# Patient Record
Sex: Female | Born: 1950 | Hispanic: Refuse to answer | Marital: Single | Smoking: Never smoker
Health system: Southern US, Community
[De-identification: ages and names within clinical notes are randomized; demographics above are authoritative.]

## PROBLEM LIST (undated history)

## (undated) DIAGNOSIS — F419 Anxiety disorder, unspecified: Secondary | ICD-10-CM

## (undated) DIAGNOSIS — N951 Menopausal and female climacteric states: Secondary | ICD-10-CM

## (undated) DIAGNOSIS — G4730 Sleep apnea, unspecified: Secondary | ICD-10-CM

## (undated) DIAGNOSIS — G4700 Insomnia, unspecified: Secondary | ICD-10-CM

## (undated) DIAGNOSIS — E785 Hyperlipidemia, unspecified: Secondary | ICD-10-CM

## (undated) DIAGNOSIS — I10 Essential (primary) hypertension: Secondary | ICD-10-CM

## (undated) DIAGNOSIS — E119 Type 2 diabetes mellitus without complications: Secondary | ICD-10-CM

## (undated) DIAGNOSIS — K621 Rectal polyp: Secondary | ICD-10-CM

## (undated) HISTORY — DX: Hyperlipidemia, unspecified: E78.5

## (undated) HISTORY — DX: Insomnia, unspecified: G47.00

## (undated) HISTORY — DX: Type 2 diabetes mellitus without complications: E11.9

## (undated) HISTORY — DX: Rectal polyp: K62.1

## (undated) HISTORY — DX: Essential (primary) hypertension: I10

## (undated) HISTORY — DX: Menopausal and female climacteric states: N95.1

## (undated) HISTORY — PX: LAPAROTOMY: SHX154

## (undated) HISTORY — DX: Anxiety disorder, unspecified: F41.9

## (undated) HISTORY — PX: OTHER SURGICAL HISTORY: SHX169

## (undated) HISTORY — PX: TONSILLECTOMY: SUR1361

## (undated) HISTORY — DX: Sleep apnea, unspecified: G47.30

---

## 2000-07-28 ENCOUNTER — Other Ambulatory Visit: Admission: RE | Admit: 2000-07-28 | Discharge: 2000-07-28 | Payer: Self-pay | Admitting: Internal Medicine

## 2000-09-02 ENCOUNTER — Encounter: Payer: Self-pay | Admitting: Internal Medicine

## 2000-09-02 ENCOUNTER — Encounter: Admission: RE | Admit: 2000-09-02 | Discharge: 2000-09-02 | Payer: Self-pay | Admitting: Internal Medicine

## 2001-04-22 LAB — HM COLONOSCOPY: HM Colonoscopy: NORMAL

## 2003-07-01 ENCOUNTER — Encounter (INDEPENDENT_AMBULATORY_CARE_PROVIDER_SITE_OTHER): Payer: Self-pay | Admitting: *Deleted

## 2003-07-01 ENCOUNTER — Ambulatory Visit (HOSPITAL_COMMUNITY): Admission: RE | Admit: 2003-07-01 | Discharge: 2003-07-01 | Payer: Self-pay | Admitting: Gastroenterology

## 2004-04-22 LAB — HM MAMMOGRAPHY: HM Mammogram: NORMAL

## 2005-10-24 ENCOUNTER — Ambulatory Visit: Payer: Self-pay | Admitting: Family Medicine

## 2005-11-29 ENCOUNTER — Ambulatory Visit: Payer: Self-pay | Admitting: Family Medicine

## 2006-03-14 ENCOUNTER — Ambulatory Visit: Payer: Self-pay | Admitting: Family Medicine

## 2006-03-14 LAB — CONVERTED CEMR LAB
ALT: 24 units/L (ref 0–40)
AST: 18 units/L (ref 0–37)
Chol/HDL Ratio, serum: 2.8
Cholesterol: 151 mg/dL (ref 0–200)
HDL: 54.1 mg/dL (ref >39.0)
LDL Cholesterol: 87 mg/dL (ref 0–99)
Triglyceride fasting, serum: 51 mg/dL (ref 0–149)
VLDL: 10 mg/dL (ref 0–40)

## 2007-03-05 ENCOUNTER — Ambulatory Visit: Payer: Self-pay | Admitting: Family Medicine

## 2007-03-05 DIAGNOSIS — E785 Hyperlipidemia, unspecified: Secondary | ICD-10-CM

## 2007-03-05 DIAGNOSIS — E119 Type 2 diabetes mellitus without complications: Secondary | ICD-10-CM | POA: Insufficient documentation

## 2007-03-05 DIAGNOSIS — K620 Anal polyp: Secondary | ICD-10-CM | POA: Insufficient documentation

## 2007-03-05 DIAGNOSIS — K621 Rectal polyp: Secondary | ICD-10-CM

## 2007-03-05 DIAGNOSIS — I10 Essential (primary) hypertension: Secondary | ICD-10-CM | POA: Insufficient documentation

## 2007-03-05 DIAGNOSIS — N809 Endometriosis, unspecified: Secondary | ICD-10-CM | POA: Insufficient documentation

## 2007-03-12 ENCOUNTER — Telehealth (INDEPENDENT_AMBULATORY_CARE_PROVIDER_SITE_OTHER): Payer: Self-pay | Admitting: *Deleted

## 2007-03-12 ENCOUNTER — Encounter (INDEPENDENT_AMBULATORY_CARE_PROVIDER_SITE_OTHER): Payer: Self-pay | Admitting: Family Medicine

## 2007-03-12 ENCOUNTER — Encounter (INDEPENDENT_AMBULATORY_CARE_PROVIDER_SITE_OTHER): Payer: Self-pay | Admitting: *Deleted

## 2007-03-16 ENCOUNTER — Ambulatory Visit: Payer: Self-pay | Admitting: Family Medicine

## 2007-03-16 DIAGNOSIS — M549 Dorsalgia, unspecified: Secondary | ICD-10-CM | POA: Insufficient documentation

## 2007-03-18 ENCOUNTER — Telehealth (INDEPENDENT_AMBULATORY_CARE_PROVIDER_SITE_OTHER): Payer: Self-pay | Admitting: *Deleted

## 2007-03-30 ENCOUNTER — Encounter (INDEPENDENT_AMBULATORY_CARE_PROVIDER_SITE_OTHER): Payer: Self-pay | Admitting: Family Medicine

## 2007-04-02 ENCOUNTER — Encounter (INDEPENDENT_AMBULATORY_CARE_PROVIDER_SITE_OTHER): Payer: Self-pay | Admitting: Family Medicine

## 2007-06-26 ENCOUNTER — Ambulatory Visit: Payer: Self-pay | Admitting: Family Medicine

## 2007-06-26 ENCOUNTER — Encounter (INDEPENDENT_AMBULATORY_CARE_PROVIDER_SITE_OTHER): Payer: Self-pay | Admitting: *Deleted

## 2007-06-26 LAB — CONVERTED CEMR LAB
ALT: 20 units/L (ref 0–35)
AST: 14 units/L (ref 0–37)
BUN: 13 mg/dL (ref 6–23)
CO2: 30 meq/L (ref 19–32)
Calcium: 9.3 mg/dL (ref 8.4–10.5)
Chloride: 108 meq/L (ref 96–112)
Cholesterol: 158 mg/dL (ref 0–200)
Creatinine, Ser: 0.9 mg/dL (ref 0.4–1.2)
GFR calc Af Amer: 84 mL/min
GFR calc non Af Amer: 69 mL/min
Glucose, Bld: 115 mg/dL — ABNORMAL HIGH (ref 70–99)
HDL: 51.1 mg/dL (ref >39.0)
Hgb A1c MFr Bld: 7.2 % — ABNORMAL HIGH (ref 4.6–6.0)
LDL Cholesterol: 97 mg/dL (ref 0–99)
Potassium: 4.1 meq/L (ref 3.5–5.1)
Sodium: 143 meq/L (ref 135–145)
Total CHOL/HDL Ratio: 3.1
Triglycerides: 50 mg/dL (ref 0–149)
VLDL: 10 mg/dL (ref 0–40)

## 2007-08-05 ENCOUNTER — Ambulatory Visit: Payer: Self-pay | Admitting: Internal Medicine

## 2007-10-02 ENCOUNTER — Ambulatory Visit: Payer: Self-pay | Admitting: Internal Medicine

## 2007-10-05 ENCOUNTER — Encounter (INDEPENDENT_AMBULATORY_CARE_PROVIDER_SITE_OTHER): Payer: Self-pay | Admitting: *Deleted

## 2007-11-03 ENCOUNTER — Encounter: Payer: Self-pay | Admitting: Internal Medicine

## 2007-11-09 ENCOUNTER — Encounter: Admission: RE | Admit: 2007-11-09 | Discharge: 2008-01-11 | Payer: Self-pay | Admitting: Internal Medicine

## 2007-11-24 ENCOUNTER — Encounter: Payer: Self-pay | Admitting: Internal Medicine

## 2007-12-22 ENCOUNTER — Ambulatory Visit: Payer: Self-pay | Admitting: Internal Medicine

## 2007-12-22 LAB — HM DIABETES FOOT EXAM

## 2007-12-29 LAB — CONVERTED CEMR LAB
BUN: 15 mg/dL (ref 6–23)
CO2: 32 meq/L (ref 19–32)
Calcium: 9.6 mg/dL (ref 8.4–10.5)
Creatinine, Ser: 1 mg/dL (ref 0.4–1.2)
GFR calc Af Amer: 74 mL/min
Glucose, Bld: 87 mg/dL (ref 70–99)

## 2008-01-01 ENCOUNTER — Encounter: Payer: Self-pay | Admitting: Internal Medicine

## 2008-01-13 ENCOUNTER — Encounter: Payer: Self-pay | Admitting: Internal Medicine

## 2008-02-15 ENCOUNTER — Encounter: Payer: Self-pay | Admitting: Internal Medicine

## 2008-03-01 ENCOUNTER — Telehealth (INDEPENDENT_AMBULATORY_CARE_PROVIDER_SITE_OTHER): Payer: Self-pay | Admitting: *Deleted

## 2008-04-26 ENCOUNTER — Encounter: Payer: Self-pay | Admitting: Internal Medicine

## 2008-07-21 LAB — HM COLONOSCOPY

## 2008-07-29 ENCOUNTER — Ambulatory Visit: Payer: Self-pay | Admitting: Internal Medicine

## 2008-08-01 LAB — CONVERTED CEMR LAB
BUN: 17 mg/dL (ref 6–23)
CO2: 31 meq/L (ref 19–32)
Chloride: 104 meq/L (ref 96–112)
Cholesterol: 142 mg/dL (ref 0–200)
GFR calc non Af Amer: 83.09 mL/min (ref >60)
Glucose, Bld: 114 mg/dL — ABNORMAL HIGH (ref 70–99)
Hgb A1c MFr Bld: 7.1 % — ABNORMAL HIGH (ref 4.6–6.5)
Microalb Creat Ratio: 2.5 mg/g (ref 0.0–30.0)
Potassium: 3.9 meq/L (ref 3.5–5.1)
Sodium: 141 meq/L (ref 135–145)
Triglycerides: 90 mg/dL (ref 0.0–149.0)
VLDL: 18 mg/dL (ref 0.0–40.0)

## 2008-08-17 ENCOUNTER — Encounter: Payer: Self-pay | Admitting: Internal Medicine

## 2008-08-24 ENCOUNTER — Encounter: Payer: Self-pay | Admitting: Internal Medicine

## 2009-02-07 ENCOUNTER — Ambulatory Visit: Payer: Self-pay | Admitting: Internal Medicine

## 2009-02-17 LAB — CONVERTED CEMR LAB
ALT: 23 units/L (ref 0–35)
AST: 16 units/L (ref 0–37)
Albumin: 3.8 g/dL (ref 3.5–5.2)
BUN: 13 mg/dL (ref 6–23)
Basophils Absolute: 0.1 10*3/uL (ref 0.0–0.1)
Creatinine, Ser: 1 mg/dL (ref 0.4–1.2)
GFR calc non Af Amer: 73.44 mL/min (ref >60)
Glucose, Bld: 106 mg/dL — ABNORMAL HIGH (ref 70–99)
Hemoglobin: 13 g/dL (ref 12.0–15.0)
Hgb A1c MFr Bld: 6.9 % — ABNORMAL HIGH (ref 4.6–6.5)
Lymphocytes Relative: 35.2 % (ref 12.0–46.0)
Monocytes Relative: 4 % (ref 3.0–12.0)
Platelets: 211 10*3/uL (ref 150.0–400.0)
Potassium: 4.3 meq/L (ref 3.5–5.1)
RDW: 13.8 % (ref 11.5–14.6)
TSH: 0.75 microintl units/mL (ref 0.35–5.50)
WBC: 4.5 10*3/uL (ref 4.5–10.5)

## 2009-03-25 ENCOUNTER — Encounter: Payer: Self-pay | Admitting: Internal Medicine

## 2009-06-09 ENCOUNTER — Encounter (INDEPENDENT_AMBULATORY_CARE_PROVIDER_SITE_OTHER): Payer: Self-pay | Admitting: *Deleted

## 2009-06-09 ENCOUNTER — Ambulatory Visit: Payer: Self-pay | Admitting: Internal Medicine

## 2009-07-14 ENCOUNTER — Encounter: Payer: Self-pay | Admitting: Internal Medicine

## 2009-08-02 ENCOUNTER — Encounter (INDEPENDENT_AMBULATORY_CARE_PROVIDER_SITE_OTHER): Payer: Self-pay | Admitting: *Deleted

## 2009-09-01 ENCOUNTER — Ambulatory Visit: Payer: Self-pay | Admitting: Internal Medicine

## 2009-09-01 DIAGNOSIS — R799 Abnormal finding of blood chemistry, unspecified: Secondary | ICD-10-CM

## 2009-09-13 ENCOUNTER — Telehealth (INDEPENDENT_AMBULATORY_CARE_PROVIDER_SITE_OTHER): Payer: Self-pay | Admitting: *Deleted

## 2009-09-13 LAB — CONVERTED CEMR LAB
Alkaline Phosphatase: 134 units/L — ABNORMAL HIGH (ref 39–117)
Bilirubin, Direct: 0.1 mg/dL (ref 0.0–0.3)

## 2009-09-14 ENCOUNTER — Encounter (INDEPENDENT_AMBULATORY_CARE_PROVIDER_SITE_OTHER): Payer: Self-pay | Admitting: *Deleted

## 2009-09-29 ENCOUNTER — Ambulatory Visit (HOSPITAL_COMMUNITY): Admission: RE | Admit: 2009-09-29 | Discharge: 2009-09-29 | Payer: Self-pay | Admitting: Internal Medicine

## 2009-10-02 ENCOUNTER — Telehealth: Payer: Self-pay | Admitting: Internal Medicine

## 2010-03-12 ENCOUNTER — Ambulatory Visit: Payer: Self-pay | Admitting: Internal Medicine

## 2010-03-12 DIAGNOSIS — M25519 Pain in unspecified shoulder: Secondary | ICD-10-CM | POA: Insufficient documentation

## 2010-03-14 LAB — CONVERTED CEMR LAB
ALT: 23 units/L (ref 0–35)
AST: 20 units/L (ref 0–37)
Alkaline Phosphatase: 138 units/L — ABNORMAL HIGH (ref 39–117)
BUN: 20 mg/dL (ref 6–23)
Bilirubin, Direct: 0 mg/dL (ref 0.0–0.3)
Chloride: 99 meq/L (ref 96–112)
Cholesterol: 189 mg/dL (ref 0–200)
Creatinine, Ser: 1 mg/dL (ref 0.4–1.2)
HDL: 55.8 mg/dL (ref >39.00)
LDL Cholesterol: 115 mg/dL — ABNORMAL HIGH (ref 0–99)
Microalb, Ur: 0.4 mg/dL (ref 0.0–1.9)
Total Bilirubin: 0.4 mg/dL (ref 0.3–1.2)
Triglycerides: 89 mg/dL (ref 0.0–149.0)
VLDL: 17.8 mg/dL (ref 0.0–40.0)

## 2010-03-29 ENCOUNTER — Telehealth (INDEPENDENT_AMBULATORY_CARE_PROVIDER_SITE_OTHER): Payer: Self-pay | Admitting: *Deleted

## 2010-04-02 ENCOUNTER — Ambulatory Visit: Payer: Self-pay | Admitting: Internal Medicine

## 2010-04-02 DIAGNOSIS — J069 Acute upper respiratory infection, unspecified: Secondary | ICD-10-CM | POA: Insufficient documentation

## 2010-05-20 LAB — CONVERTED CEMR LAB
ALT: 29 units/L (ref 0–35)
BUN: 12 mg/dL (ref 6–23)
CO2: 33 meq/L — ABNORMAL HIGH (ref 19–32)
Calcium: 10.3 mg/dL (ref 8.4–10.5)
Chloride: 103 meq/L (ref 96–112)
Cholesterol: 220 mg/dL (ref 0–200)
Creatinine, Ser: 0.9 mg/dL (ref 0.4–1.2)
Creatinine,U: 150.9 mg/dL
Direct LDL: 162.2 mg/dL
GFR calc Af Amer: 84 mL/min
GFR calc non Af Amer: 69 mL/min
Glucose, Bld: 95 mg/dL (ref 70–99)
HDL: 53.1 mg/dL (ref >39.0)
Hgb A1c MFr Bld: 6.9 % — ABNORMAL HIGH (ref 4.6–6.0)
Microalb Creat Ratio: 2 mg/g (ref 0.0–30.0)
Microalb, Ur: 0.3 mg/dL (ref 0.0–1.9)
Potassium: 4.3 meq/L (ref 3.5–5.1)
Sodium: 143 meq/L (ref 135–145)
Total CHOL/HDL Ratio: 4.1
Triglycerides: 73 mg/dL (ref 0–149)
VLDL: 15 mg/dL (ref 0–40)

## 2010-05-22 NOTE — Progress Notes (Signed)
Summary: [facility] correct [facility]  Phone Note Call from Patient   Caller: Patient Summary of Call: patient called to request that we enter her pharmacy choice to be the [facility] [address] [facility], NOT the [facility] [place] has been able to have [facility] [place] [facility] in the past, but would like [place] to note the correct pharmacy for any future prescriptions or refills Initial call taken by: Jerolyn Shin,  March 29, 2010 9:21 AM  Follow-up for Phone Call        noted. old pharmacies were removed.  Follow-up by: Army Fossa CMA,  March 29, 2010 9:29 AM

## 2010-05-22 NOTE — Progress Notes (Signed)
Summary: lab results  Phone Note Outgoing Call Call back at Home Phone [phone] Call back at Work Phone [phone]   Reason for Call: Discuss lab or test results Details for Reason: advise patient: Alkaline phosphatase continued to be slightly elevated, please schedule a bone scan, DX increased AP diabetes well controlled RF all  medications for a year Signed by Elita Quick E. Paz MD on 09/13/2009 at 2:52 PM Summary of Call: discussed with pt.......Marland KitchenShary Decamp  Sep 13, 2009 3:26 PM     Prescriptions: PROTONIX 40 MG TBEC (PANTOPRAZOLE SODIUM) Take one tablet daily  #30 x 11   Entered by:   Shary Decamp   Authorized by:   Nolon Rod. Paz MD   Signed by:   Shary Decamp on 09/13/2009   Method used:   Electronically to        [facility]* (retail)       [address] [place], [place]  [address]       Ph: [phone]       Fax: [phone]   RxID:   [id number] LIPITOR 10 MG TABS (ATORVASTATIN CALCIUM) Take one tablet daily  #30.0 Each x 11   Entered by:   Shary Decamp   Authorized by:   Nolon Rod. Paz MD   Signed by:   Shary Decamp on 09/13/2009   Method used:   Electronically to        [facility]* (retail)       [address] [place], [place]  [address]       Ph: [phone]       Fax: [phone]   RxID:   [id number] METFORMIN HCL 500 MG TABS (METFORMIN HCL) 1 by mouth two times a day  #60.0 Each x 11   Entered by:   Shary Decamp   Authorized by:   Nolon Rod. Paz MD   Signed by:   Shary Decamp on 09/13/2009   Method used:   Electronically to        [facility]* (retail)       [address] [place], [place]  [address]       Ph: [phone]       Fax: [phone]   RxID:   [id number] LISINOPRIL 10 MG TABS (LISINOPRIL) Take one tablet daily  #30.0 Each x 11   Entered by:   Shary Decamp   Authorized by:   Nolon Rod. Paz MD   Signed by:   Shary Decamp on 09/13/2009   Method used:   Electronically to        [facility]*  (retail)       [address] [place], [place]  [address]       Ph: [phone]       Fax: [phone]   RxID:   [id number] AMLODIPINE BESYLATE 5 MG TABS (AMLODIPINE BESYLATE) Take one tablet daily  #30.0 Each x 11   Entered by:   Shary Decamp   Authorized by:   Nolon Rod. Paz MD   Signed by:   Shary Decamp on 09/13/2009   Method used:   Electronically to        [facility]* (retail)       [address] [place], [place]  [address]  Ph: [phone]       Fax: [phone]   RxID:   [id number] METOPROLOL TARTRATE 25 MG  TABS (METOPROLOL TARTRATE) TAKE ONE TABLET TWICE DAILY  #60.0 Each x 11   Entered by:   Shary Decamp   Authorized by:   Nolon Rod. Paz MD   Signed by:   Shary Decamp on 09/13/2009   Method used:   Electronically to        [facility]* (retail)       [address] [place], [place]  [address]       Ph: [phone]       Fax: [phone]   RxID:   [id number] HYDROCHLOROTHIAZIDE 25 MG  TABS (HYDROCHLOROTHIAZIDE) Take one tablet daily  #30.0 Each x 11   Entered by:   Shary Decamp   Authorized by:   Nolon Rod. Paz MD   Signed by:   Shary Decamp on 09/13/2009   Method used:   Electronically to        [facility]* (retail)       [address] [place], [place]  [address]       Ph: [phone]       Fax: [phone]   RxID:   [id number]

## 2010-05-22 NOTE — Progress Notes (Signed)
Summary: result  Phone Note Call from Patient Call back at Work Phone [phone] Call back at [phone]   Caller: Patient Summary of Call: Pt requesting result of bone scan. pls advise..............Marland KitchenFelecia Deloach CMA  October 02, 2009 11:58 AM   advised patient  bone scan  essentially negative She has some  OA and possibly a gallstone Plan: Repeat LFTs in November when she comes back for her followup Jony Ladnier E. Demyan Fugate MD  October 02, 2009 12:41 PM   Follow-up for Phone Call        left message to call  office.Marland KitchenMarland KitchenFelecia Deloach CMA  October 02, 2009 5:08 PM   DISCUSS WITH PATIENT .Felecia Deloach CMA  October 03, 2009 8:47 AM

## 2010-05-22 NOTE — Letter (Signed)
Summary: Primary Care Appointment Letter  [facility] [place]  [address] [place], [place] [address]   Phone: [phone]  Fax: [phone]    08/02/2009 MRN: [id number]  Nancy Martin [address] [place], [place]  [address]  Dear Ms. Wussow,   Your Primary Care Physician Pantops E. Paz MD has indicated that:    __X_____it is time to schedule an appointment.  Please call [phone] to schedule an office visit with Dr. Drue Novel.   Thank you,    [facility] Scheduler

## 2010-05-22 NOTE — Letter (Signed)
Summary: [place] No Show Letter  [facility] [place]  [address] [place], [place] [address]   Phone: [phone]  Fax: [phone]    06/09/2009 MRN: [id number]  SHANDELL GIOVANNI [address] [place], [place]  [address]   Dear Ms. Beel,   Our records indicate that you missed your scheduled appointment with Dr. Drue Novel on 06/09/09.  Please contact this office to reschedule your appointment as soon as possible.  It is important that you keep your scheduled appointments with your physician, so we can provide you the best care possible.  Please be advised that there may be a charge for "no show" appointments.    Sincerely,   [place]

## 2010-05-22 NOTE — Letter (Signed)
Summary: Primary Care Consult Scheduled Letter  [facility] [place]  [address] [place], [place] [address]   Phone: [phone]  Fax: [phone]      09/14/2009 MRN: [id number]  Nancy Martin [address] [place], [place]  [address]    Dear [name],    We have scheduled an appointment for you.  At the recommendation of Dr. Willow Ora, we have scheduled you for a Bone Scan with [facility] on 09-29-2009 arrive by 9:45am, and register in Admitting.  Their address is [address], [place] [address]. The office phone number is [phone].  If this appointment day and time is not convenient for you, please feel free to call the office of the doctor you are being referred to at the number listed above and reschedule the appointment.    It is important for you to keep your scheduled appointments. We are here to make sure you are given good patient care.   Thank you,    Renee, Patient Care Coordinator [place] [facility]

## 2010-05-22 NOTE — Assessment & Plan Note (Signed)
Summary: 4 mth fu/ns/kdc   Vital Signs:  Patient profile:   60 year old female Height:      62 inches Weight:      227.6 pounds Pulse rate:   70 / minute BP sitting:   132 / 94 CC: rov, fasting   History of Present Illness: ROV  Diabetes-- no recent ambulatory CBGs , diet not as good as before   Hyperlipidemia-- good medication compliance , still has occasionally aches and pain , nothing unusual ; pain usually at knes and back, mostly if she "overdo"  Hypertension-- good medication compliance, no ambulatory BPs     Current Medications (verified): 1)  Hydrochlorothiazide 25 Mg  Tabs (Hydrochlorothiazide) .... Take One Tablet Daily 2)  Metoprolol Tartrate 25 Mg  Tabs (Metoprolol Tartrate) .... Take One Tablet Twice Daily 3)  Amlodipine Besylate 5 Mg Tabs (Amlodipine Besylate) .... Take One Tablet Daily 4)  Lisinopril 10 Mg Tabs (Lisinopril) .... Take One Tablet Daily 5)  Metformin Hcl 500 Mg Tabs (Metformin Hcl) .Marland Kitchen.. 1 By Mouth Two Times A Day 6)  Lipitor 10 Mg Tabs (Atorvastatin Calcium) .... Take One Tablet Daily 7)  Protonix 40 Mg Tbec (Pantoprazole Sodium) .... Take One Tablet Daily  Allergies (verified): No Known Drug Allergies  Past History:  Past Medical History: G0 Diabetes mellitus, type II Hyperlipidemia Hypertension ENDOMETRIOSIS SLEEP APNEA, no CPAP can't tolerate  h/o RECTAL POLYPS  2008: CP, saw Dr Mayford Knife, stress test-- low risk MRI 9-09 herniated disk, conservative treatment   Past Surgical History: Reviewed history from 10/02/2007 and no changes required. Tonsillectomy Laparotomy-endometriosis  Social History: Reviewed history from 02/07/2009 and no changes required. 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 Resp:  Denies cough and shortness of breath. GI:  Denies  diarrhea, nausea, and vomiting. Psych:  Denies anxiety and depression.  Physical Exam  General:  alert, well-developed, and overweight-appearing.   Lungs:  normal respiratory effort, no intercostal retractions, no accessory muscle use, and normal breath sounds.   Heart:  normal rate, regular rhythm, no murmur, and no gallop.   Abdomen:  soft, non-tender, and no distention.   Extremities:  no pretibial edema bilaterally    Impression & Recommendations:  Problem # 1:  HYPERTENSION (ICD-401.9) BP slightly elevated today, instructions Her updated medication list for this problem includes:    Hydrochlorothiazide 25 Mg Tabs (Hydrochlorothiazide) .Marland Kitchen... Take one tablet daily    Metoprolol Tartrate 25 Mg Tabs (Metoprolol tartrate) .Marland Kitchen... Take one tablet twice daily    Amlodipine Besylate 5 Mg Tabs (Amlodipine besylate) .Marland Kitchen... Take one tablet daily    Lisinopril 10 Mg Tabs (Lisinopril) .Marland Kitchen... Take one tablet daily  BP today: 132/94 Prior BP: 132/88 (02/07/2009)  Labs Reviewed: K+: 4.3 (02/07/2009) Creat: : 1.0 (02/07/2009)   Chol: 142 (07/29/2008)   HDL: 45.10 (07/29/2008)   LDL: 79 (07/29/2008)   TG: 90.0 (07/29/2008)  Problem # 2:  OTHER NONSPECIFIC FINDINGS EXAMINATION OF BLOOD (ICD-790.99) slightly increased alkaline phosphate, will start by checking a GGT, further testing if needed  Orders: Venipuncture ([address]) TLB-Hepatic/Liver Function Pnl (80076-HEPATIC) TLB-GGT (Gamma GT) (82977-GGT)  Problem # 3:  DIABETES MELLITUS, TYPE II (ICD-250.00) her diet has not been as good as before, encouraged healthy diet and exercise Reading material provided about an upcoming free seminar for diabetes also provided information  about hemoglobin A1c Her updated medication list for this problem includes:    Lisinopril 10 Mg Tabs (Lisinopril) .Marland Kitchen... Take one tablet daily    Metformin Hcl 500 Mg Tabs (Metformin hcl) .Marland Kitchen... 1 by mouth two times a day   Labs Reviewed: Creat: 1.0 (02/07/2009)      Reviewed HgBA1c results: 6.9 (02/07/2009)  7.1 (07/29/2008)  Orders: TLB-A1C / Hgb A1C (Glycohemoglobin) (83036-A1C)  Problem # 4:  HYPERLIPIDEMIA (ICD-272.4) no change Her updated medication list for this problem includes:    Lipitor 10 Mg Tabs (Atorvastatin calcium) .Marland Kitchen... Take one tablet daily  Labs Reviewed: SGOT: 16 (02/07/2009)   SGPT: 23 (02/07/2009)   HDL:45.10 (07/29/2008), 51.1 (06/26/2007)  LDL:79 (07/29/2008), 97 (06/26/2007)  Chol:142 (07/29/2008), 158 (06/26/2007)  Trig:90.0 (07/29/2008), 50 (06/26/2007)  Complete Medication List: 1)  Hydrochlorothiazide 25 Mg Tabs (Hydrochlorothiazide) .... Take one tablet daily 2)  Metoprolol Tartrate 25 Mg Tabs (Metoprolol tartrate) .... Take one tablet twice daily 3)  Amlodipine Besylate 5 Mg Tabs (Amlodipine besylate) .... Take one tablet daily 4)  Lisinopril 10 Mg Tabs (Lisinopril) .... Take one tablet daily 5)  Metformin Hcl 500 Mg Tabs (Metformin hcl) .Marland Kitchen.. 1 by mouth two times a day 6)  Lipitor 10 Mg Tabs (Atorvastatin calcium) .... Take one tablet daily 7)  Protonix 40 Mg Tbec (Pantoprazole sodium) .... Take one tablet daily  Patient Instructions: 1)  Please schedule a follow-up appointment in 6 months .

## 2010-05-22 NOTE — Assessment & Plan Note (Signed)
Summary: RTO 6 MONTHS/KN   Vital Signs:  Patient profile:   60 year old female Height:      62 inches Weight:      232.38 pounds BMI:     42.66 Pulse rate:   92 / minute Pulse rhythm:   regular BP sitting:   128 / 84  (left arm) Cuff size:   large  Vitals Entered By: Army Fossa CMA (March 12, 2010 8:01 AM) CC: 6 month f/u- fasting  Comments c/o pain in (L) shoulder x 2 weeks [facility]    History of Present Illness: ROV  c/o R shoulder pain ; pain  actually located at the upper trapezoid area. Better with Tylenol, worse at night Diabetes -- no ambulatory CBGs , exercise not consistent ; does watch her diet Hyperlipidemia-- good medication compliance , no myalgias per se occasionally arthralgias "if the weather is bad" Hypertension-- ambulatory BPs occasionally elevated in the 150s but most time wnl     Allergies: No Known Drug Allergies  Past History:  Past Medical History: Reviewed history from 09/01/2009 and no changes required. G0 Diabetes mellitus, type II Hyperlipidemia Hypertension ENDOMETRIOSIS SLEEP APNEA, no CPAP can't tolerate  h/o RECTAL POLYPS  2008: CP, saw Dr Mayford Knife, stress test-- low risk MRI 9-09 herniated disk, conservative treatment   Past Surgical History: Reviewed history from 10/02/2007 and no changes required. Tonsillectomy Laparotomy-endometriosis  Social History: Reviewed history from 02/07/2009 and no changes required. 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 Resp:  Denies cough and shortness of breath. GI:  Denies bloody stools, nausea, and vomiting. MS:  still occasionally has LBP No neck pain or upper extremity paresthesias.Marland Kitchen Psych:  (+) stress at work .  Physical Exam  General:  alert, well-developed, and overweight-appearing.  Wt up 5 pounds  Lungs:  normal respiratory  effort, no intercostal retractions, no accessory muscle use, and normal breath sounds.   Heart:  normal rate, regular rhythm, no murmur, and no gallop.   Extremities:  no lower extremity edema. Inspection and palpation of the wrists and hands normal, no puffiness or warmness of the joints. Shoulders with normal range of motion. palpation of the upper right trapezoid slightly tender, some muscle spasm Psych:  Oriented X3, good eye contact, not anxious appearing, and not depressed appearing.     Impression & Recommendations:  Problem # 1:  SHOULDER PAIN (ICD-719.41) pain located at the right trapezoid area. No neck pain or upper extremity paresthesias. see  instructions  Her updated medication list for this problem includes:    Flexeril 10 Mg Tabs (Cyclobenzaprine hcl) .Marland Kitchen... 1 by mouth at bedtime as needed shoulder pain  Problem # 2:  HYPERTENSION (ICD-401.9) at goal, recommend ambulatory monitoring. See instructions  Her updated medication list for this problem includes:    Hydrochlorothiazide 25 Mg Tabs (Hydrochlorothiazide) .Marland Kitchen... Take one tablet daily    Metoprolol Tartrate 25 Mg Tabs (Metoprolol tartrate) .Marland Kitchen... Take one tablet twice daily    Amlodipine Besylate 5 Mg Tabs (Amlodipine besylate) .Marland Kitchen... Take one tablet daily    Lisinopril 10 Mg Tabs (Lisinopril) .Marland Kitchen... Take one tablet daily    BP today: 128/84 Prior BP: 132/94 (09/01/2009)  Labs Reviewed: K+: 4.3 (02/07/2009) Creat: : 1.0 (02/07/2009)   Chol: 142 (07/29/2008)   HDL: 45.10 (07/29/2008)   LDL: 79 (07/29/2008)   TG: 90.0 (07/29/2008)  Orders: Venipuncture (  [address]) TLB-BMP (Basic Metabolic Panel-BMET) (80048-METABOL) Specimen Handling ([address])  Problem # 3:  HYPERLIPIDEMIA (ICD-272.4) labs  Her updated medication list for this problem includes:    Lipitor 10 Mg Tabs (Atorvastatin calcium) .Marland Kitchen... Take one tablet daily    Labs Reviewed: SGOT: 22 (09/01/2009)   SGPT: 30 (09/01/2009)   HDL:45.10 (07/29/2008), 51.1  (06/26/2007)  LDL:79 (07/29/2008), 97 (06/26/2007)  Chol:142 (07/29/2008), 158 (06/26/2007)  Trig:90.0 (07/29/2008), 50 (06/26/2007)  Orders: TLB-Lipid Panel (80061-LIPID) Specimen Handling ([address])  Problem # 4:  DIABETES MELLITUS, TYPE II (ICD-250.00) has gained few pounds. Diet exercise discussed Her updated medication list for this problem includes:    Lisinopril 10 Mg Tabs (Lisinopril) .Marland Kitchen... Take one tablet daily    Metformin Hcl 500 Mg Tabs (Metformin hcl) .Marland Kitchen... 1 by mouth two times a day  Orders: TLB-A1C / Hgb A1C (Glycohemoglobin) (83036-A1C) TLB-Microalbumin/Creat Ratio, Urine (82043-MALB) Specimen Handling (91478)  Problem # 5:  HEALTH SCREENING (ICD-V70.0) got  a flu shot already Encouraged to see gynecology for her female care  Complete Medication List: 1)  Hydrochlorothiazide 25 Mg Tabs (Hydrochlorothiazide) .... Take one tablet daily 2)  Metoprolol Tartrate 25 Mg Tabs (Metoprolol tartrate) .... Take one tablet twice daily 3)  Amlodipine Besylate 5 Mg Tabs (Amlodipine besylate) .... Take one tablet daily 4)  Lisinopril 10 Mg Tabs (Lisinopril) .... Take one tablet daily 5)  Metformin Hcl 500 Mg Tabs (Metformin hcl) .Marland Kitchen.. 1 by mouth two times a day 6)  Lipitor 10 Mg Tabs (Atorvastatin calcium) .... Take one tablet daily 7)  Protonix 40 Mg Tbec (Pantoprazole sodium) .... Take one tablet daily 8)  Flexeril 10 Mg Tabs (Cyclobenzaprine hcl) .Marland Kitchen.. 1 by mouth at bedtime as needed shoulder pain  Other Orders: TLB-Hepatic/Liver Function Pnl (80076-HEPATIC)  Patient Instructions: 1)  warm compress to the shoulder at night. 2)  For pain he can use Tylenol and Flexeril, a muscle relaxant. Flexeril may cause drowsiness 3)  ambulatory BPs 4)  diet! 5)  exercise! 6)  Please schedule a follow-up appointment in 4 months .  Prescriptions: FLEXERIL 10 MG TABS (CYCLOBENZAPRINE HCL) 1 by mouth at bedtime as needed shoulder pain  #30 x 0   Entered and Authorized by:   Nolon Rod. Paz MD    Signed by:   Nolon Rod. Paz MD on 03/12/2010   Method used:   Print then Give to Patient   RxID:   [id number]    Orders Added: 1)  Venipuncture [[address]] 2)  TLB-Hepatic/Liver Function Pnl [80076-HEPATIC] 3)  TLB-A1C / Hgb A1C (Glycohemoglobin) [83036-A1C] 4)  TLB-Microalbumin/Creat Ratio, Urine [82043-MALB] 5)  TLB-Lipid Panel [80061-LIPID] 6)  TLB-BMP (Basic Metabolic Panel-BMET) [80048-METABOL] 7)  Specimen Handling [99000] 8)  Est. Patient Level IV [[address]]   Immunization History:  Influenza Immunization History:    Influenza:  historical (01/09/2010)   Immunization History:  Influenza Immunization History:    Influenza:  Historical (01/09/2010)

## 2010-05-24 NOTE — Assessment & Plan Note (Signed)
Summary: FOR COUGH AND COD//PH   Vital Signs:  Patient profile:   60 year old female Weight:      225.13 pounds O2 Sat:      94 % on Room air Temp:     98.5 degrees F oral Pulse rate:   96 / minute Pulse rhythm:   regular BP sitting:   122 / 82  (left arm) Cuff size:   large  Vitals Entered By: Army Fossa CMA (April 02, 2010 9:03 AM)  O2 Flow:  Room air CC: Pt here c/o chest congestion Comments x 1week feels very fatigued [facility] [place] Fasting    History of Present Illness: x 1 week fatigue, chest congestion  Current Medications (verified): 1)  Hydrochlorothiazide 25 Mg  Tabs (Hydrochlorothiazide) .... Take One Tablet Daily 2)  Metoprolol Tartrate 25 Mg  Tabs (Metoprolol Tartrate) .... Take One Tablet Twice Daily 3)  Amlodipine Besylate 5 Mg Tabs (Amlodipine Besylate) .... Take One Tablet Daily 4)  Lisinopril 10 Mg Tabs (Lisinopril) .... Take One Tablet Daily 5)  Metformin Hcl 1000 Mg Tabs (Metformin Hcl) .Marland Kitchen.. 1 By Mouth Two Times A Day. 6)  Lipitor 20 Mg Tabs (Atorvastatin Calcium) .... Take One Tablet Daily. 7)  Protonix 40 Mg Tbec (Pantoprazole Sodium) .... Take One Tablet Daily 8)  Flexeril 10 Mg Tabs (Cyclobenzaprine Hcl) .Marland Kitchen.. 1 By Mouth At Bedtime As Needed Shoulder Pain  Allergies (verified): No Known Drug Allergies  Past History:  Past Medical History: Reviewed history from 09/01/2009 and no changes required. G0 Diabetes mellitus, type II Hyperlipidemia Hypertension ENDOMETRIOSIS SLEEP APNEA, no CPAP can't tolerate  h/o RECTAL POLYPS  2008: CP, saw Dr Mayford Knife, stress test-- low risk MRI 9-09 herniated disk, conservative treatment   Past Surgical History: Reviewed history from 10/02/2007 and no changes required. Tonsillectomy Laparotomy-endometriosis  Review of Systems General:  Denies fever; some chills . ENT:  Complains of sore throat; ++ RN. Resp:  mild cough, ligh color sputum occasionally wheezing can't sleep well d/t  cough. GI:  Denies diarrhea, nausea, and vomiting. MS:  Denies muscle aches.  Physical Exam  General:  alert, well-developed, and overweight-appearing.   Head:  face symmetric, not tender to palpation Ears:  R ear normal and L ear normal.   Nose:  not  congested Mouth:  no redness or discharge Lungs:  normal respiratory effort, no intercostal retractions, no accessory muscle use, and normal breath sounds.   Heart:  normal rate, regular rhythm, and no murmur.      Impression & Recommendations:  Problem # 1:  URI (ICD-465.9) see instructions   consider antibiotics if no better Her updated medication list for this problem includes:    Hydrocodone-homatropine 5-1.5 Mg/64ml Syrp (Hydrocodone-homatropine) .Marland KitchenMarland KitchenMarland KitchenMarland Kitchen 5 to 10cc by mouth at night as needed for cough  Complete Medication List: 1)  Hydrochlorothiazide 25 Mg Tabs (Hydrochlorothiazide) .... Take one tablet daily 2)  Metoprolol Tartrate 25 Mg Tabs (Metoprolol tartrate) .... Take one tablet twice daily 3)  Amlodipine Besylate 5 Mg Tabs (Amlodipine besylate) .... Take one tablet daily 4)  Lisinopril 10 Mg Tabs (Lisinopril) .... Take one tablet daily 5)  Metformin Hcl 1000 Mg Tabs (Metformin hcl) .Marland Kitchen.. 1 by mouth two times a day. 6)  Lipitor 20 Mg Tabs (Atorvastatin calcium) .... Take one tablet daily. 7)  Protonix 40 Mg Tbec (Pantoprazole sodium) .... Take one tablet daily 8)  Flexeril 10 Mg Tabs (Cyclobenzaprine hcl) .Marland Kitchen.. 1 by mouth at bedtime as needed shoulder pain 9)  Hydrocodone-homatropine 5-1.5 Mg/35ml Syrp (Hydrocodone-homatropine) .... 5 to 10cc by mouth at night as needed for cough  Patient Instructions: 1)  rest, fluids, tylenol 2)  mucinex DM twice a day as needed for cough 3)  hydrocodone if cough severe at night 4)  call if no better in 2 -3 days  Prescriptions: HYDROCODONE-HOMATROPINE 5-1.5 MG/5ML SYRP (HYDROCODONE-HOMATROPINE) 5 to 10cc by mouth at night as needed for cough  #120cc x 0   Entered and Authorized by:    Nolon Rod. Paz MD   Signed by:   Nolon Rod. Paz MD on 04/02/2010   Method used:   Print then Give to Patient   RxID:   [id number]    Orders Added: 1)  Est. Patient Level III [56213]

## 2010-06-09 ENCOUNTER — Encounter: Payer: Self-pay | Admitting: Internal Medicine

## 2010-06-28 NOTE — Letter (Signed)
Summary: Diabetic Eye exam-- negative  Diabetic Eye exam/[facility]   Imported By: Maryln Gottron 06/15/2010 12:42:08  _____________________________________________________________________  External Attachment:    Type:   Image     Comment:   External Document

## 2010-07-13 ENCOUNTER — Ambulatory Visit (INDEPENDENT_AMBULATORY_CARE_PROVIDER_SITE_OTHER): Payer: 59 | Admitting: Internal Medicine

## 2010-07-13 ENCOUNTER — Encounter: Payer: Self-pay | Admitting: Internal Medicine

## 2010-07-13 DIAGNOSIS — E785 Hyperlipidemia, unspecified: Secondary | ICD-10-CM

## 2010-07-13 DIAGNOSIS — R945 Abnormal results of liver function studies: Secondary | ICD-10-CM | POA: Insufficient documentation

## 2010-07-13 DIAGNOSIS — E119 Type 2 diabetes mellitus without complications: Secondary | ICD-10-CM

## 2010-07-13 DIAGNOSIS — I10 Essential (primary) hypertension: Secondary | ICD-10-CM

## 2010-07-13 LAB — LIPID PANEL
Cholesterol: 127 mg/dL (ref 0–200)
HDL: 46.3 mg/dL (ref >39.00)
LDL Cholesterol: 71 mg/dL (ref 0–99)
Triglycerides: 48 mg/dL (ref 0.0–149.0)
VLDL: 9.6 mg/dL (ref 0.0–40.0)

## 2010-07-13 MED ORDER — CYCLOBENZAPRINE HCL 10 MG PO TABS: 10.0000 mg | ORAL_TABLET | Freq: Every evening | ORAL | Status: DC | PRN

## 2010-07-13 NOTE — Assessment & Plan Note (Signed)
Doing great, has changed her diet,exercising more

## 2010-07-13 NOTE — Patient Instructions (Signed)
You are doing great! Check your sugar at night to see if it is low

## 2010-07-13 NOTE — Assessment & Plan Note (Signed)
We increased med dose base on last FLP, tolerates well

## 2010-07-13 NOTE — Assessment & Plan Note (Signed)
At goal Good med compliance

## 2010-07-13 NOTE — Progress Notes (Signed)
  Subjective:    Patient ID: Nancy Martin, female    DOB: July 04, 1950, 60 y.o.   MRN: [id number]  HPI DM-- increased Glucophage base on last A1C , she also improved diet; occ shaky at night (no CBGs at those times) Chol-- increase lipitor few months ago HTN--   ambulatory BPs usually ok     Review of Systems  Respiratory: Negative for cough and shortness of breath.   Cardiovascular: Negative for chest pain. Leg swelling: occ. ankle edema if she is up all day.  Gastrointestinal: Negative for nausea. Diarrhea: occ. diarrhea (loose stools) depending on diet, + w/  dairy products   Neurological: Negative for numbness.       No LE paresthesias    Past Medical History  Diagnosis Date  . Diabetes mellitus, type 2   . Hyperlipemia   . Hypertension   . Endometriosis   . Sleep apnea     can't tolerate CPAP  . Rectal polyp     h/o  . Chest pain 2008    saw Dr.Turner, stress test--low risk         Objective:   Physical Exam  Constitutional: She appears well-developed.       Has lost some wt!  Cardiovascular: Normal rate, regular rhythm and normal heart sounds.   Pulmonary/Chest: Effort normal and breath sounds normal.  Musculoskeletal: She exhibits no edema.  Psychiatric: She has a normal mood and affect. Her behavior is normal. Judgment and thought content normal.          Assessment & Plan:

## 2010-07-14 ENCOUNTER — Encounter: Payer: Self-pay | Admitting: Internal Medicine

## 2010-07-14 NOTE — Assessment & Plan Note (Signed)
Alkaline phosphatase was slightly elevated,  GGT normal,NUCLEAR MEDICINE WHOLE BODY BONE SCINTIGRAPHY  09-29-09 negative except for some DJD.

## 2010-07-18 ENCOUNTER — Telehealth: Payer: Self-pay | Admitting: *Deleted

## 2010-07-18 MED ORDER — ZOLPIDEM TARTRATE 10 MG PO TABS: 10.0000 mg | ORAL_TABLET | Freq: Every evening | ORAL | Status: DC | PRN

## 2010-07-18 NOTE — Telephone Encounter (Signed)
Message copied by Doristine Devoid on Wed Jul 18, 2010 11:56 AM ------      Message from: Willow Ora      Created: Tue Jul 17, 2010  4:33 PM       Ok call Remus Loffler 10mg   1 po qhs, #30, 1 RF

## 2010-07-18 NOTE — Telephone Encounter (Signed)
Message copied by Army Fossa on Wed Jul 18, 2010  1:45 PM ------      Message from: Willow Ora      Created: Mon Jul 16, 2010 11:04 AM       Advise patient:       her cholesterol and DM are well controlled,  Improved compared to the last time.      Very good results

## 2010-07-26 ENCOUNTER — Other Ambulatory Visit: Payer: Self-pay | Admitting: Internal Medicine

## 2010-09-07 NOTE — Op Note (Signed)
NAME:  Nancy Martin, Nancy Martin                         ACCOUNT NO.:  [id number]   MEDICAL RECORD NO.:  [id number]                   PATIENT TYPE:  AMB   LOCATION:  ENDO                                 FACILITY:  [facility]   PHYSICIAN:  Graylin Shiver, M.D.                DATE OF BIRTH:  [birth year]   DATE OF PROCEDURE:  07/01/2003  DATE OF DISCHARGE:                                 OPERATIVE REPORT   PROCEDURE:  Colonoscopy with polypectomy.   INDICATIONS:  Screening. Informed consent was obtained after explanation of  risks such as bleeding and perforation.   PREMEDICATION:  Fentanyl 70 mcg IV, Versed 7 mg IV.   DESCRIPTION OF PROCEDURE:  With the patient the left lateral decubitus  position, rectal exam was performed, no masses were felt. The Olympus  colonoscope was inserted into the rectum, advanced around the colon to the  cecum.  Cecal landmarks were identified. The cecum and ascending colon were  normal. The transverse colon was normal, the descending colon was normal.  In the proximal sigmoid colon, there was an 8 mm sessile polyp which was  snared and removed by snare cautery technique.  In the rectum, there were  two small 4 mm sessile polyps, snared and removed by snare cautery  technique. All cautery sites looked good.  The patient tolerated the  procedure well without complications.   IMPRESSION:  1. Sigmoid polyp.  CPT Code 211.3.  2. Two small rectal polyps.  CPT Code 211.4.   PLAN:  The pathology will be checked.                                               Graylin Shiver, M.D.    SFG/MEDQ  D:  07/01/2003  T:  07/02/2003  Job:  [id number]   cc:   Sharlet Salina, M.D.  [address]  [place]  [place] [address]  Fax: [phone]

## 2010-09-20 ENCOUNTER — Other Ambulatory Visit: Payer: Self-pay | Admitting: Internal Medicine

## 2011-03-22 ENCOUNTER — Other Ambulatory Visit: Payer: Self-pay | Admitting: Internal Medicine

## 2011-03-25 NOTE — Telephone Encounter (Signed)
Refills denied. Last Office visit 06/2010

## 2011-04-24 ENCOUNTER — Other Ambulatory Visit: Payer: Self-pay | Admitting: Internal Medicine

## 2011-04-24 MED ORDER — METFORMIN HCL 1000 MG PO TABS: ORAL_TABLET | ORAL | Status: DC

## 2011-04-24 MED ORDER — ATORVASTATIN CALCIUM 20 MG PO TABS: ORAL_TABLET | ORAL | Status: DC

## 2011-04-24 NOTE — Telephone Encounter (Signed)
Rx sent to pharmacy   

## 2011-04-25 ENCOUNTER — Other Ambulatory Visit: Payer: Self-pay | Admitting: Internal Medicine

## 2011-05-23 ENCOUNTER — Encounter: Payer: Self-pay | Admitting: Internal Medicine

## 2011-05-23 ENCOUNTER — Ambulatory Visit (INDEPENDENT_AMBULATORY_CARE_PROVIDER_SITE_OTHER): Payer: 59 | Admitting: Internal Medicine

## 2011-05-23 DIAGNOSIS — E785 Hyperlipidemia, unspecified: Secondary | ICD-10-CM

## 2011-05-23 DIAGNOSIS — I10 Essential (primary) hypertension: Secondary | ICD-10-CM

## 2011-05-23 DIAGNOSIS — E119 Type 2 diabetes mellitus without complications: Secondary | ICD-10-CM

## 2011-05-23 DIAGNOSIS — Z0000 Encounter for general adult medical examination without abnormal findings: Secondary | ICD-10-CM

## 2011-05-23 LAB — COMPREHENSIVE METABOLIC PANEL
ALT: 17 U/L (ref 0–35)
AST: 18 U/L (ref 0–37)
CO2: 30 mEq/L (ref 19–32)
Calcium: 9.5 mg/dL (ref 8.4–10.5)
Chloride: 102 mEq/L (ref 96–112)
Creatinine, Ser: 0.9 mg/dL (ref 0.4–1.2)
GFR: 83.35 mL/min (ref >60.00)
Potassium: 3.9 mEq/L (ref 3.5–5.1)
Sodium: 141 mEq/L (ref 135–145)
Total Protein: 7.7 g/dL (ref 6.0–8.3)

## 2011-05-23 LAB — CBC WITH DIFFERENTIAL/PLATELET
Basophils Absolute: 0 10*3/uL (ref 0.0–0.1)
Eosinophils Absolute: 0.2 10*3/uL (ref 0.0–0.7)
Hemoglobin: 13.6 g/dL (ref 12.0–15.0)
Lymphocytes Relative: 39.6 % (ref 12.0–46.0)
MCHC: 33.3 g/dL (ref 30.0–36.0)
Monocytes Relative: 6.4 % (ref 3.0–12.0)
Neutro Abs: 2.7 10*3/uL (ref 1.4–7.7)
Neutrophils Relative %: 50.3 % (ref 43.0–77.0)
RBC: 4.49 Mil/uL (ref 3.87–5.11)
RDW: 15.1 % — ABNORMAL HIGH (ref 11.5–14.6)

## 2011-05-23 LAB — LIPID PANEL
LDL Cholesterol: 78 mg/dL (ref 0–99)
Total CHOL/HDL Ratio: 3
Triglycerides: 59 mg/dL (ref 0.0–149.0)

## 2011-05-23 LAB — TSH: TSH: 0.89 u[IU]/mL (ref 0.35–5.50)

## 2011-05-23 NOTE — Assessment & Plan Note (Signed)
reports good medication compliance Labs

## 2011-05-23 NOTE — Progress Notes (Signed)
  Subjective:    Patient ID: Nancy Martin, female    DOB: 11/18/1950, 61 y.o.   MRN: [id number]  HPI Here for a CPX but also for management of chronic medical problems i review her med list, good med compliance Does not check amb CBGs amb BPs in the 120/80s Still has issues sleeping , uses ambien prn  Past Medical History: G0 Diabetes mellitus, type II Hyperlipidemia Hypertension Endometriosis menopausal SLEEP APNEA, no CPAP can't tolerate  h/o RECTAL POLYPS  2008: CP, saw Dr Mayford Knife, stress test-- low risk MRI 9-09 herniated disk, conservative treatment   Past Surgical History: Tonsillectomy Laparotomy-endometriosis  Family History: Thyroid dz-- M DM - M, F CAD , MI - no CHF-- M HTN - M, F stroke - ? 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 Very rarely has diarrhea without blood in the stools. She has a lot of stress but no actual anxiety or depression. Occasionally feels palpitations, mostly at night for the last month. Again she admits to a lot of stress, has not changed her caffeine intake. Symptoms are not associated with shortness of breath, diaphoresis, chest pain or near syncope.     Objective:   Physical Exam  Constitutional: She is oriented to person, place, and time. She appears well-developed. No distress.       Morbidly obese  Neck: No thyromegaly present.  Cardiovascular: Normal rate, regular rhythm and normal heart sounds.   No murmur heard. Pulmonary/Chest: Effort normal and breath sounds normal. No respiratory distress. She has no wheezes. She has no rales.  Abdominal: Soft. Bowel sounds are normal. She exhibits no distension. There is no tenderness. There is no rebound and no guarding.    Musculoskeletal: She exhibits no edema.  Neurological: She is alert and oriented to person, place, and time. No cranial nerve deficit.  Skin: She is not diaphoretic.  Psychiatric: She has a normal mood and affect. Her behavior is normal. Judgment and thought content normal.       Assessment & Plan:  Palpitations without red flag symptoms, recommend observation, will call if symptoms increase.

## 2011-05-23 NOTE — Assessment & Plan Note (Addendum)
Td 06 pneumonia shot 08 Had a Flu shot colonoscopy  07/2008.  Two polyps, hyperplastic.  Dr. Evette Cristal female care: gyn referal  Refer for a MMG

## 2011-05-23 NOTE — Assessment & Plan Note (Signed)
On diet control, diet-exer cise discussed and encouraged  Labs

## 2011-05-23 NOTE — Assessment & Plan Note (Signed)
Well controlled, labs  

## 2011-05-23 NOTE — Patient Instructions (Signed)
Exercise : 30 minutes a day, brisk walk! Diet: think about W W , do you need to see a nutritionist ?

## 2011-05-28 ENCOUNTER — Encounter: Payer: Self-pay | Admitting: Internal Medicine

## 2011-05-30 ENCOUNTER — Other Ambulatory Visit: Payer: Self-pay | Admitting: Internal Medicine

## 2011-05-31 ENCOUNTER — Other Ambulatory Visit: Payer: Self-pay | Admitting: Internal Medicine

## 2011-05-31 NOTE — Telephone Encounter (Signed)
Refill request for Ambien 10mg . #30 with zero refills. OK to refill?

## 2011-05-31 NOTE — Telephone Encounter (Signed)
Ok #30, 1 RF 

## 2011-06-03 NOTE — Telephone Encounter (Signed)
Refill done.  

## 2011-06-18 IMAGING — CR DG THORACIC SPINE 2V
3 series · 3 of 3 positions shown · non-contrast
Comparison: None.

CLINICAL DATA: Small foci of increased activity on today's bone
scan

THORACIC SPINE - 2 VIEW

[t t-spine a.p.]
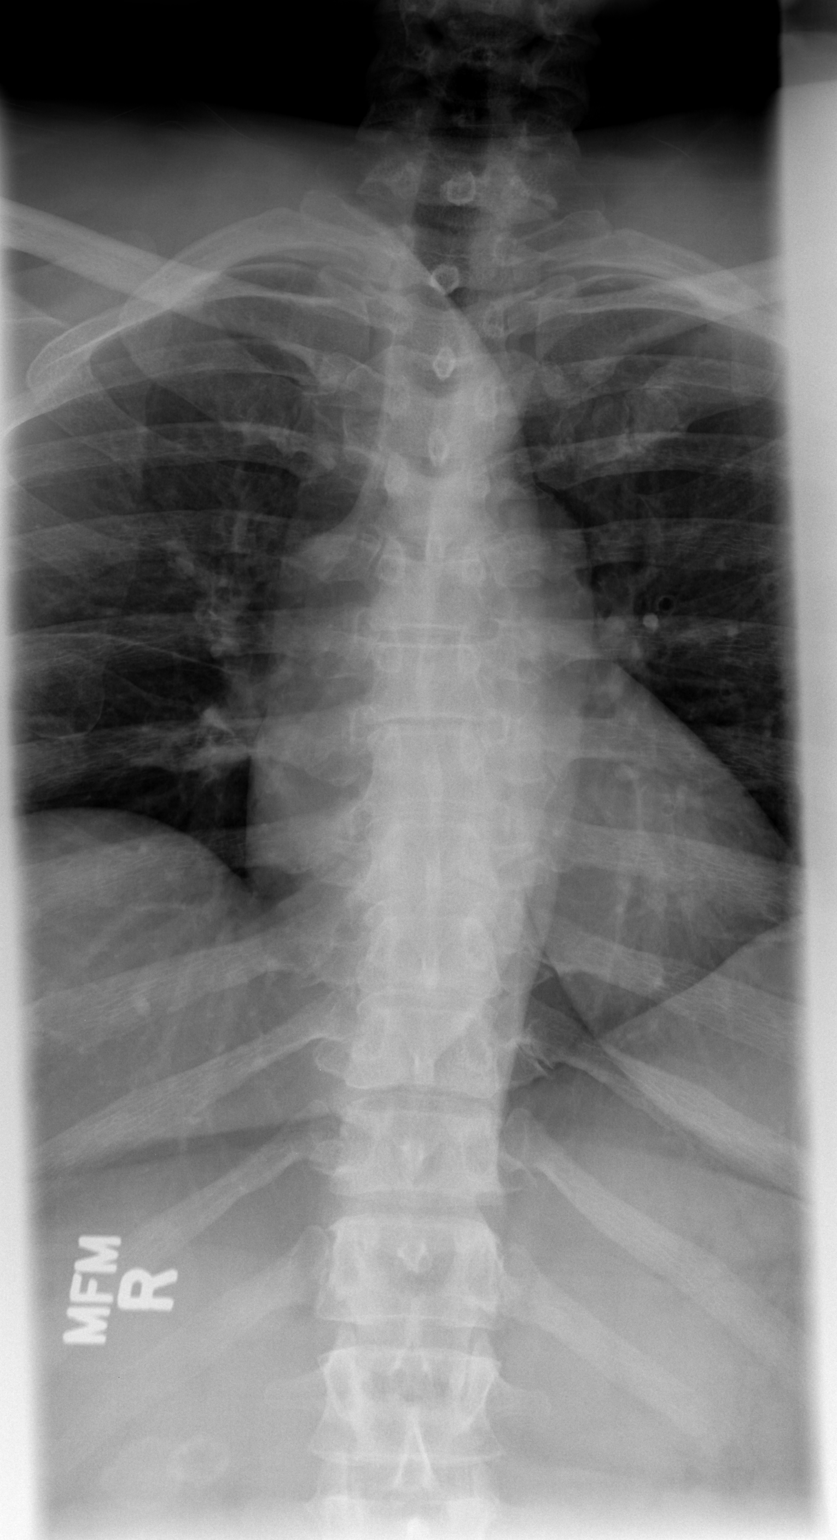

[t t-spine lat]
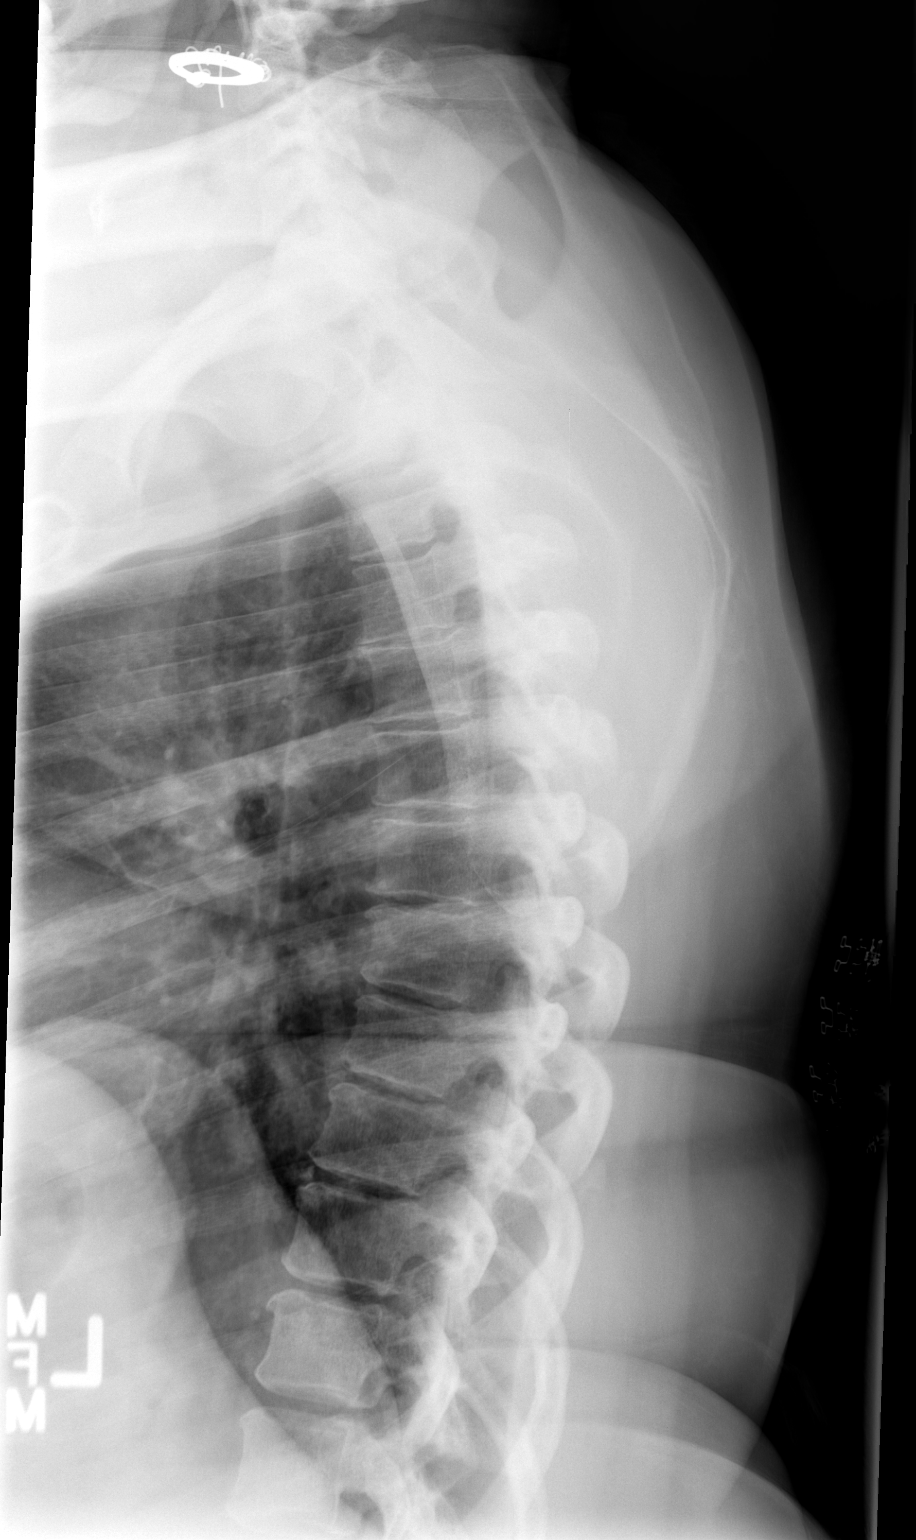

[t swimmers *]
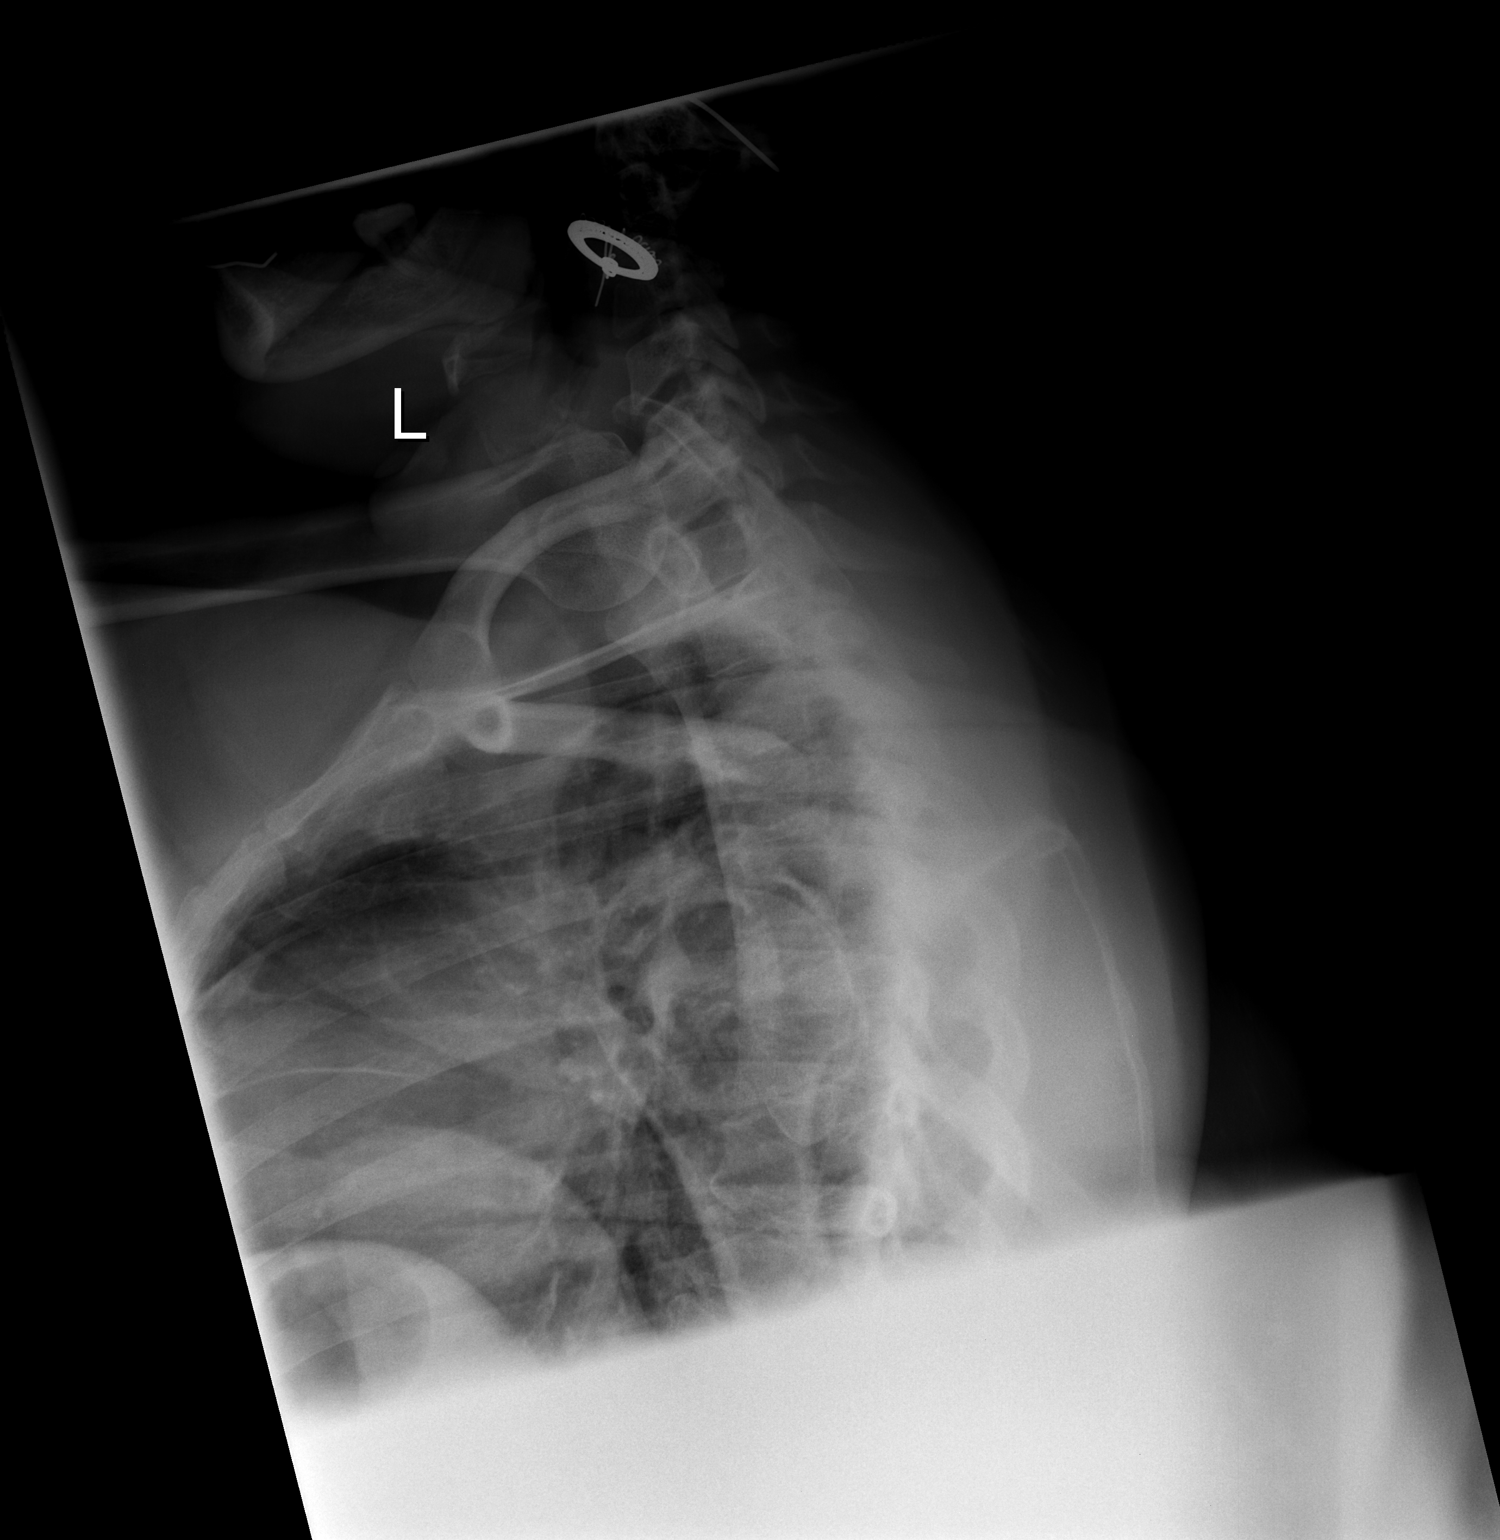

[3 of 3 positions shown; findings below may reference images not displayed]

FINDINGS: Small foci of increased activity in the upper and mid
thoracic spine appear to correlate with minimal degenerative change
in the thoracic spine.  No lytic or blastic lesion is seen.
IMPRESSION: Small foci of activity on bone scan within the thoracic spine
correlate with mild degenerative change.

## 2011-06-27 LAB — HM PAP SMEAR: HM PAP: NORMAL

## 2011-07-01 LAB — HM DIABETES EYE EXAM

## 2011-07-10 ENCOUNTER — Encounter: Payer: Self-pay | Admitting: *Deleted

## 2011-09-20 ENCOUNTER — Ambulatory Visit: Payer: 59 | Admitting: Internal Medicine

## 2011-09-30 ENCOUNTER — Encounter: Payer: Self-pay | Admitting: Internal Medicine

## 2011-09-30 ENCOUNTER — Ambulatory Visit (INDEPENDENT_AMBULATORY_CARE_PROVIDER_SITE_OTHER): Payer: 59 | Admitting: Internal Medicine

## 2011-09-30 VITALS — BP 126/84 | HR 75 | Temp 98.1°F | Wt 211.0 lb

## 2011-09-30 DIAGNOSIS — I10 Essential (primary) hypertension: Secondary | ICD-10-CM

## 2011-09-30 DIAGNOSIS — Z0000 Encounter for general adult medical examination without abnormal findings: Secondary | ICD-10-CM

## 2011-09-30 DIAGNOSIS — E785 Hyperlipidemia, unspecified: Secondary | ICD-10-CM

## 2011-09-30 DIAGNOSIS — E119 Type 2 diabetes mellitus without complications: Secondary | ICD-10-CM

## 2011-09-30 LAB — BASIC METABOLIC PANEL
Calcium: 9.6 mg/dL (ref 8.4–10.5)
GFR: 83.25 mL/min (ref >60.00)
Glucose, Bld: 99 mg/dL (ref 70–99)
Sodium: 144 mEq/L (ref 135–145)

## 2011-09-30 NOTE — Assessment & Plan Note (Signed)
Request a HIV test, ordered

## 2011-09-30 NOTE — Assessment & Plan Note (Signed)
Well controlled, labs  

## 2011-09-30 NOTE — Patient Instructions (Signed)
You are doing great, schedule a return visit for a physical (04-2012) Keep up the good job Get a flu shot this fall

## 2011-09-30 NOTE — Progress Notes (Signed)
  Subjective:    Patient ID: Nancy Martin, female    DOB: 22-Jan-1951, 61 y.o.   MRN: [id number]  HPI ROV DM, doing great, walking 15 min most days, diet improved, taking a healthy shake for brekfast HTN, good med compliance cholesterol-- good med compliance w/o apparent s/e Gynecology requested a HIV test, will do. Taking Ca and Vit D qd   Past Medical History:  G0  Diabetes mellitus, type II  Hyperlipidemia  Hypertension  Endometriosis  menopausal  SLEEP APNEA, no CPAP can't tolerate  h/o RECTAL POLYPS  2008: CP, saw Dr Mayford Knife, stress test-- low risk  MRI 9-09 herniated disk, conservative treatment  Past Surgical History:  Tonsillectomy  Laparotomy-endometriosis  Family History:  Thyroid dz-- M  DM - M, F  CAD , MI - no  CHF-- M  HTN - M, F  stroke - ? 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 No apparent distress.  Lungs -- normal respiratory effort, no intercostal retractions, no accessory muscle use, and normal breath sounds.   Heart-- normal rate, regular rhythm, no murmur, and no gallop.   DIABETIC FEET EXAM: No lower extremity edema Normal pedal pulses bilaterally Skin and nails are normal without calluses Pinprick examination of the feet normal. Psych-- Cognition and judgment appear intact. Alert and cooperative with normal attention span and concentration.  not anxious appearing and not depressed appearing.        Assessment & Plan:

## 2011-09-30 NOTE — Assessment & Plan Note (Signed)
Doing great w/ diet, exercise ---praised Labs

## 2011-09-30 NOTE — Assessment & Plan Note (Signed)
good medication compliance , last FLP at goal, no change

## 2011-10-01 LAB — HIV ANTIBODY (ROUTINE TESTING W REFLEX): HIV: NONREACTIVE

## 2011-10-02 ENCOUNTER — Encounter: Payer: Self-pay | Admitting: *Deleted

## 2011-12-24 ENCOUNTER — Other Ambulatory Visit: Payer: Self-pay | Admitting: Internal Medicine

## 2011-12-25 NOTE — Telephone Encounter (Signed)
Refill done.  

## 2012-02-19 ENCOUNTER — Other Ambulatory Visit: Payer: Self-pay | Admitting: Internal Medicine

## 2012-02-20 NOTE — Telephone Encounter (Signed)
Refill done.  

## 2012-04-02 ENCOUNTER — Other Ambulatory Visit: Payer: Self-pay | Admitting: Internal Medicine

## 2012-04-02 DIAGNOSIS — I10 Essential (primary) hypertension: Secondary | ICD-10-CM

## 2012-04-03 NOTE — Telephone Encounter (Signed)
Refill for lopressor sent to pharmacy. SJ

## 2012-05-22 ENCOUNTER — Encounter: Payer: Self-pay | Admitting: Lab

## 2012-05-25 ENCOUNTER — Ambulatory Visit (INDEPENDENT_AMBULATORY_CARE_PROVIDER_SITE_OTHER): Payer: 59 | Admitting: Internal Medicine

## 2012-05-25 ENCOUNTER — Encounter: Payer: Self-pay | Admitting: Internal Medicine

## 2012-05-25 VITALS — BP 114/78 | HR 83 | Temp 97.6°F | Ht 60.0 in | Wt 216.0 lb

## 2012-05-25 DIAGNOSIS — I10 Essential (primary) hypertension: Secondary | ICD-10-CM

## 2012-05-25 DIAGNOSIS — Z0000 Encounter for general adult medical examination without abnormal findings: Secondary | ICD-10-CM

## 2012-05-25 DIAGNOSIS — E119 Type 2 diabetes mellitus without complications: Secondary | ICD-10-CM

## 2012-05-25 LAB — LIPID PANEL
HDL: 50.8 mg/dL (ref >39.00)
LDL Cholesterol: 82 mg/dL (ref 0–99)
Total CHOL/HDL Ratio: 3
Triglycerides: 80 mg/dL (ref 0.0–149.0)
VLDL: 16 mg/dL (ref 0.0–40.0)

## 2012-05-25 LAB — CBC WITH DIFFERENTIAL/PLATELET
Basophils Relative: 0.5 % (ref 0.0–3.0)
Hemoglobin: 13.5 g/dL (ref 12.0–15.0)
Lymphocytes Relative: 35.4 % (ref 12.0–46.0)
MCHC: 33.3 g/dL (ref 30.0–36.0)
Monocytes Relative: 6.9 % (ref 3.0–12.0)
Neutro Abs: 3.1 10*3/uL (ref 1.4–7.7)
RBC: 4.59 Mil/uL (ref 3.87–5.11)

## 2012-05-25 LAB — COMPREHENSIVE METABOLIC PANEL
AST: 16 U/L (ref 0–37)
BUN: 19 mg/dL (ref 6–23)
Calcium: 9.2 mg/dL (ref 8.4–10.5)
Chloride: 101 mEq/L (ref 96–112)
Creatinine, Ser: 1 mg/dL (ref 0.4–1.2)
GFR: 71.78 mL/min (ref >60.00)

## 2012-05-25 MED ORDER — METOPROLOL TARTRATE 25 MG PO TABS: 25.0000 mg | ORAL_TABLET | Freq: Two times a day (BID) | ORAL | Status: DC

## 2012-05-25 MED ORDER — ZOLPIDEM TARTRATE 10 MG PO TABS: 10.0000 mg | ORAL_TABLET | Freq: Every evening | ORAL | Status: DC | PRN

## 2012-05-25 MED ORDER — ATORVASTATIN CALCIUM 20 MG PO TABS: 20.0000 mg | ORAL_TABLET | Freq: Every day | ORAL | Status: DC

## 2012-05-25 MED ORDER — AMLODIPINE BESYLATE 5 MG PO TABS: 5.0000 mg | ORAL_TABLET | Freq: Every day | ORAL | Status: DC

## 2012-05-25 MED ORDER — LISINOPRIL 10 MG PO TABS: 10.0000 mg | ORAL_TABLET | Freq: Every day | ORAL | Status: DC

## 2012-05-25 MED ORDER — METFORMIN HCL 1000 MG PO TABS: 1000.0000 mg | ORAL_TABLET | Freq: Two times a day (BID) | ORAL | Status: DC

## 2012-05-25 MED ORDER — PANTOPRAZOLE SODIUM 40 MG PO TBEC: 40.0000 mg | DELAYED_RELEASE_TABLET | Freq: Every day | ORAL | Status: DC

## 2012-05-25 MED ORDER — HYDROCHLOROTHIAZIDE 25 MG PO TABS: 25.0000 mg | ORAL_TABLET | Freq: Every day | ORAL | Status: DC

## 2012-05-25 NOTE — Assessment & Plan Note (Addendum)
Td 06 pneumonia shot 08 Had a Flu shot colonoscopy  07/2008.  Two polyps, hyperplastic.  Dr. Evette Cristal female care: saw gyn , due for another visit , encourage to set up an appointment No recent  MMG, will schedule one , strongly encouraged to go Has gained some weight since the last time she was here, encouraged to go back to her healthier diet and stay active. Gym membership?

## 2012-05-25 NOTE — Progress Notes (Signed)
  Subjective:    Patient ID: Nancy Martin, female    DOB: [birth year], 62 y.o.   MRN: [id number]  HPI CPX   Past Medical History:  G0  Diabetes mellitus, type II  Hyperlipidemia  Hypertension  Endometriosis  menopausal  SLEEP APNEA, can't tolerate  CPAP   h/o RECTAL POLYPS  2008: CP, saw Dr Mayford Knife, stress test-- low risk  MRI 9-09 herniated disk, conservative treatment   Past Surgical History:  Tonsillectomy  Laparotomy-endometriosis   Family History:  Thyroid dz-- M  DM - M, F  CAD , MI - no  CHF-- M  HTN - M, F  stroke - ? 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 Good compliance with all medications. Ambulatory blood sugars "okay", does not remember readings. No ambulatory BPs. Denies chest pain or shortness of breath No nausea, vomiting. Occasional diarrhea usually related to diet. No blood in the stools. Some stress in her life, was slightly anxious, now is better. No depression. Denies need of taking  medication. Diet and exercise have not been as well as before, has gained few pounds     Objective:   Physical Exam General -- alert, well-developed, and overweight appearing. No apparent distress.  Neck --no thyromegaly, normal carotid pulses Lungs -- normal respiratory effort, no intercostal retractions, no accessory muscle use, and normal breath sounds.   Heart-- normal rate, regular rhythm, no murmur, and no gallop.   Abdomen--soft, non-tender, no distention, no masses, no HSM, no guarding, and no rigidity.   DIABETIC FEET EXAM: No lower extremity edema Normal pedal pulses bilaterally Skin and nails are normal without calluses Pinprick examination of the feet normal. Neurologic-- alert & oriented X3 and strength normal in all  extremities. Psych-- Cognition and judgment appear intact. Alert and cooperative with normal attention span and concentration.  not anxious appearing and not depressed appearing.      Assessment & Plan:   Chronic medical problems, good medication compliance, check labs

## 2012-05-28 ENCOUNTER — Encounter: Payer: Self-pay | Admitting: *Deleted

## 2012-06-14 LAB — HM MAMMOGRAPHY: HM MAMMO: NORMAL

## 2012-06-16 ENCOUNTER — Ambulatory Visit
Admission: RE | Admit: 2012-06-16 | Discharge: 2012-06-16 | Disposition: A | Discharge status: Home / Self Care | Payer: 59 | Source: Ambulatory Visit | Attending: Internal Medicine | Admitting: Internal Medicine

## 2012-07-20 ENCOUNTER — Ambulatory Visit (INDEPENDENT_AMBULATORY_CARE_PROVIDER_SITE_OTHER): Payer: 59 | Admitting: Internal Medicine

## 2012-07-20 ENCOUNTER — Telehealth: Payer: Self-pay | Admitting: Internal Medicine

## 2012-07-20 ENCOUNTER — Encounter: Payer: Self-pay | Admitting: Internal Medicine

## 2012-07-20 VITALS — BP 130/88 | HR 75 | Temp 98.2°F | Wt 212.6 lb

## 2012-07-20 DIAGNOSIS — R209 Unspecified disturbances of skin sensation: Secondary | ICD-10-CM

## 2012-07-20 DIAGNOSIS — E119 Type 2 diabetes mellitus without complications: Secondary | ICD-10-CM

## 2012-07-20 DIAGNOSIS — R0789 Other chest pain: Secondary | ICD-10-CM | POA: Insufficient documentation

## 2012-07-20 DIAGNOSIS — R200 Anesthesia of skin: Secondary | ICD-10-CM

## 2012-07-20 DIAGNOSIS — I10 Essential (primary) hypertension: Secondary | ICD-10-CM

## 2012-07-20 DIAGNOSIS — K219 Gastro-esophageal reflux disease without esophagitis: Secondary | ICD-10-CM

## 2012-07-20 LAB — CK TOTAL AND CKMB (NOT AT ARMC): Total CK: 97 U/L (ref 7–177)

## 2012-07-20 LAB — CBC WITH DIFFERENTIAL/PLATELET
Basophils Absolute: 0 10*3/uL (ref 0.0–0.1)
Lymphocytes Relative: 39.5 % (ref 12.0–46.0)
Monocytes Relative: 6.6 % (ref 3.0–12.0)
Platelets: 249 10*3/uL (ref 150.0–400.0)
RDW: 14.9 % — ABNORMAL HIGH (ref 11.5–14.6)

## 2012-07-20 LAB — D-DIMER, QUANTITATIVE: D-Dimer, Quant: 0.39 ug/mL-FEU (ref 0.00–0.48)

## 2012-07-20 NOTE — Telephone Encounter (Signed)
Patient Information:  Caller Name: Makinsey  Phone: [phone]  Patient: Nancy Martin, Nancy Martin  Gender: Female  DOB: [birth year]  Age: 62 Years  PCP: Willow Ora  Office Follow Up:  Does the office need to follow up with this patient?: No  Instructions For The Office: N/A  RN Note:  Patient states she developed chest heaviness in "the middle" of chest, onset 07/19/12 0900. States felt " tingly" in left shoulder. States discomfort lasted a " few minutes" and resolved. Denies shortness of breath, diaphoresis, nausea or dizziness. States pain has not reoccurred. Patient denies any sx at present. Denies any calf pain. Patient states she did have a long care ride ( 7 hours) on 07/18/12.  Care advice given per guidelines. Call back parameters reviewed. Patient verbalizes understanding. No appts. available with Dr. Drue Novel. Appt. scheduled for 07/20/12 0915 with Dr. Alwyn Ren.  Symptoms  Reason For Call & Symptoms: Chest pain Meds: Metformin, Lisinopril, HCTZ, Amlodipine, Hx: Diabetes, HTN, High Cholesterol  Reviewed Health History In EMR: Yes  Reviewed Medications In EMR: Yes  Reviewed Allergies In EMR: Yes  Reviewed Surgeries / Procedures: Yes  Date of Onset of Symptoms: 07/19/2012  Guideline(s) Used:  Chest Pain  Disposition Per Guideline:   Go to ED Now  Reason For Disposition Reached:   Recent long-distance travel with prolonged time in car, bus, plane, or train (i.e., within past 2 weeks; 6 or more hours duration)  Advice Given:  Call Back If:  Severe chest pain  Constant chest pain lasting longer than 5 minutes  Difficulty breathing  Fever  You become worse.  RN Overrode Recommendation:  Make Appointment  Per standing orders, office is open  Appointment Scheduled:  07/20/2012 09:15:00 Appointment Scheduled Provider:  Marga Melnick

## 2012-07-20 NOTE — Progress Notes (Signed)
  Subjective:    Patient ID: Nancy Martin, female    DOB: 05-23-1950, 62 y.o.   MRN: [id number]  HPI  Symptoms began 07/19/12 as a heaviness and sensation of gas after awakening. This occurred while she was supine and was localized to the substernal area and right upper quadrant. This persisted for less than 1 minute and has not recurred. She attributes this to stress.  She describes minor dyspepsia despite Protonix. She also has occasional dysphagia particularly with foods such as meats.  Past medical history/family history/social history were all reviewed and updated. Pertinent data :her brother died of a stroke within the last 2-3 weeks; his daughter died 2 weeks later. There is no family history of heart disease  The deaths in her family necessitated a  trip to [place]. That trip involved 7.5 hours up & back riding as a passenger  in the 48-72 hours prior to the onset of the symptoms noted above.    Review of Systems She denies associated nausea or diaphoresis. She's had no exertional chest pain. She also denies unexplained weight loss, melena, or rectal bleeding.  FBS not checked. A1c 6.8% in 2/14     Objective:   Physical Exam General appearance is one of good health and nourishment w/o distress.  Eyes: No conjunctival inflammation or scleral icterus is present.  Oral exam: Dentures; lips and gums are healthy appearing.There is no oropharyngeal erythema or exudate noted.   Heart:  Normal rate and regular rhythm. S1 and S2 normal without gallop, murmur, click, rub or other extra sounds. S4   Lungs:Chest clear to auscultation; no wheezes, rhonchi,rales ,or rubs present.No increased work of breathing.   Abdomen: bowel sounds normal, soft and non-tender without masses, organomegaly or hernias noted.  No guarding or rebound   Skin:Warm & dry.  Intact without suspicious lesions or rashes ; no jaundice or tenting   All pulses intact without  bruits .No ischemic skin changes. Homan's  negative  Lymphatic: No lymphadenopathy is noted about the head, neck, axilla, or inguinal areas.             Assessment & Plan:  #1 atypical self-limited chest/upper quadrant pain. This is in the context of prolonged travel in the 48-72 hours prior to onset.  #2 minor nonspecific T changes with possible decreased he voltage compared to 03/05/07. No definite ischemic changes.  #3 diabetes, adequate control   #4 GERD with intermittent dysphagia  Plan: See orders and recommendations.

## 2012-07-20 NOTE — Patient Instructions (Addendum)
Reflux of gastric acid may be asymptomatic as this may occur mainly during sleep.The triggers for reflux  include stress; the "aspirin family" ; alcohol; peppermint; and caffeine (coffee, tea, cola, and chocolate). The aspirin family would include aspirin and the nonsteroidal agents such as ibuprofen &  Naproxen. Tylenol would not cause reflux. If having symptoms ; food & drink should be avoided for @ least 2 hours before going to bed.  

## 2012-07-20 NOTE — Telephone Encounter (Signed)
Pt is scheduled to see Dr. Alwyn Ren today @ [phone].

## 2012-07-28 ENCOUNTER — Ambulatory Visit (INDEPENDENT_AMBULATORY_CARE_PROVIDER_SITE_OTHER): Payer: 59 | Admitting: Internal Medicine

## 2012-07-28 VITALS — BP 118/72 | HR 73 | Temp 97.8°F | Wt 213.0 lb

## 2012-07-28 DIAGNOSIS — R0789 Other chest pain: Secondary | ICD-10-CM

## 2012-07-28 DIAGNOSIS — F329 Major depressive disorder, single episode, unspecified: Secondary | ICD-10-CM

## 2012-07-28 NOTE — Progress Notes (Signed)
  Subjective:    Patient ID: Nancy Martin, female    DOB: 1950-09-21, 62 y.o.   MRN: [id number]  HPI followup Was seen a few days ago with a single episode of heaviness, "gas" at the chest and right upper quadrant of the abdomen. D-dimer, CK and CBC were normal. EKG showed no acute changes. No further episodes. The patient states today that she thinks she was quite distressed, she lost a brother and niece and is not coping well with her loss. Has been unable to cry, denies anxiety, rather feels sad, down. No suicidal thoughts.  Past Medical History  Diagnosis Date  . Diabetes mellitus, type 2   . Hyperlipemia   . Hypertension   . Endometriosis   . Sleep apnea     can't tolerate CPAP  . Rectal polyp     h/o  . Chest pain 2008    saw Dr.TraciTurner, stress test--low risk  . Menopausal state     Past Surgical History  Procedure Laterality Date  . Tonsillectomy    . Laparotomy      endometriosis  . Colonoscopy with polypectomy      Review of Systems Denies nausea vomiting. No fever chills. No further chest pain or right upper quadrant pain.     Objective:   Physical Exam  General -- alert, well-developed, no apparent distress .   Neck --no thyromegaly , normal carotid pulse, no LADs  Lungs -- normal respiratory effort, no intercostal retractions, no accessory muscle use, and normal breath sounds.   Heart-- normal rate, regular rhythm, no murmur, and no gallop.   Abdomen--soft, non-tender, no distention, no masses, no HSM, no guarding, and no rigidity.    Neurologic-- alert & oriented X3 and strength normal in all extremities. Psych-- Cognition and judgment appear intact. Alert and cooperative with normal attention span and concentration.  not anxious appearing and not depressed appearing.        Assessment & Plan:

## 2012-07-28 NOTE — Patient Instructions (Addendum)
Next visit in 3 months  

## 2012-07-29 ENCOUNTER — Encounter: Payer: Self-pay | Admitting: Internal Medicine

## 2012-07-29 DIAGNOSIS — F411 Generalized anxiety disorder: Secondary | ICD-10-CM | POA: Insufficient documentation

## 2012-07-29 NOTE — Assessment & Plan Note (Signed)
Single episode of chest pain, right upper quadrant pain. Symptoms resolve, if symptoms resurface patient will call, will make further eval (stress test? Ultrasound to r/o GB? )

## 2012-07-29 NOTE — Assessment & Plan Note (Addendum)
Depression, new problem Depression related to recent loss, patient is counseled, PHQ 9 today: 9 today (mild depression). She is already thinking about counseling, encouraged to make an appointment w/ Raynelle Fanning or other counselor Will call if no better for possibly a Rx and re-evaluation

## 2012-09-01 ENCOUNTER — Ambulatory Visit (INDEPENDENT_AMBULATORY_CARE_PROVIDER_SITE_OTHER): Payer: 59 | Admitting: Licensed Clinical Social Worker

## 2012-09-01 DIAGNOSIS — F39 Unspecified mood [affective] disorder: Secondary | ICD-10-CM

## 2012-09-24 ENCOUNTER — Ambulatory Visit (INDEPENDENT_AMBULATORY_CARE_PROVIDER_SITE_OTHER): Payer: 59 | Admitting: Licensed Clinical Social Worker

## 2012-09-24 DIAGNOSIS — F39 Unspecified mood [affective] disorder: Secondary | ICD-10-CM

## 2012-10-22 ENCOUNTER — Ambulatory Visit (INDEPENDENT_AMBULATORY_CARE_PROVIDER_SITE_OTHER): Payer: 59 | Admitting: Licensed Clinical Social Worker

## 2012-10-22 DIAGNOSIS — F39 Unspecified mood [affective] disorder: Secondary | ICD-10-CM

## 2012-11-10 ENCOUNTER — Ambulatory Visit: Payer: 59 | Admitting: Licensed Clinical Social Worker

## 2012-11-23 ENCOUNTER — Ambulatory Visit (INDEPENDENT_AMBULATORY_CARE_PROVIDER_SITE_OTHER): Payer: 59 | Admitting: Internal Medicine

## 2012-11-23 ENCOUNTER — Encounter: Payer: Self-pay | Admitting: Internal Medicine

## 2012-11-23 VITALS — BP 120/68 | HR 85 | Temp 98.4°F | Wt 205.6 lb

## 2012-11-23 DIAGNOSIS — E119 Type 2 diabetes mellitus without complications: Secondary | ICD-10-CM

## 2012-11-23 DIAGNOSIS — G4700 Insomnia, unspecified: Secondary | ICD-10-CM

## 2012-11-23 DIAGNOSIS — R0789 Other chest pain: Secondary | ICD-10-CM

## 2012-11-23 DIAGNOSIS — F329 Major depressive disorder, single episode, unspecified: Secondary | ICD-10-CM

## 2012-11-23 DIAGNOSIS — I10 Essential (primary) hypertension: Secondary | ICD-10-CM

## 2012-11-23 LAB — BASIC METABOLIC PANEL
BUN: 17 mg/dL (ref 6–23)
CO2: 27 mEq/L (ref 19–32)
Calcium: 9.5 mg/dL (ref 8.4–10.5)
Creatinine, Ser: 1.1 mg/dL (ref 0.4–1.2)
GFR: 65.63 mL/min (ref >60.00)
Glucose, Bld: 99 mg/dL (ref 70–99)

## 2012-11-23 NOTE — Assessment & Plan Note (Signed)
resoved

## 2012-11-23 NOTE — Assessment & Plan Note (Addendum)
On Nancy Martin, would like to try melatonin , that is great, she will let me know if that works.

## 2012-11-23 NOTE — Assessment & Plan Note (Signed)
S/p counseling, feels better

## 2012-11-23 NOTE — Progress Notes (Signed)
  Subjective:    Patient ID: Nancy Martin, female    DOB: 1950-06-13, 62 y.o.   MRN: [id number]  HPI Routine office visit Diabetes, good medication metformin, not ambulatory CBGs High cholesterol, good medication medicines, no apparent side effects Hypertension, good medication compliance, ambulatory BPs checked from time to time and normal. Insomnia, would like to try melatonin instead of Ambien.  Past Medical History  Diagnosis Date  . Diabetes mellitus, type 2   . Hyperlipemia   . Hypertension   . Endometriosis   . Sleep apnea     can't tolerate CPAP  . Rectal polyp     h/o  . Chest pain 2008    saw Dr.TraciTurner, stress test--low risk  . Menopausal state   . Insomnia    Past Surgical History  Procedure Laterality Date  . Tonsillectomy    . Laparotomy      endometriosis  . Colonoscopy with polypectomy     History   Social History  . Marital Status: Single    Spouse Name: N/A    Number of Children: 1  . Years of Education: N/A   Occupational History  . Not on file.   Social History Main Topics  . Smoking status: Never Smoker   . Smokeless tobacco: Not on file  . Alcohol Use: 0.0 oz/week     Comment: rarely  . Drug Use: No  . Sexually Active: Not on file   Other Topics Concern  . Not on file   Social History Narrative  . No narrative on file     Review of Systems Diet--improved, has lost some weight, appetite is decreased. Exercise--walks at least 5 times a week! Depression, improving, had counseling. No chest pain, shortness or breath No  nausea, vomiting, diarrhea.     Objective:   Physical Exam  BP 120/68  Pulse 85  Temp(Src) 98.4 F (36.9 C) (Oral)  Wt 205 lb 9.6 oz (93.26 kg)  BMI 40.15 kg/m2  SpO2 96% General -- alert, well-developed, NAD .    Lungs -- normal respiratory effort, no intercostal retractions, no accessory muscle use, and normal breath sounds.   Heart-- normal rate, regular rhythm, no murmur, and no gallop.    DIABETIC FEET EXAM: No lower extremity edema Normal pedal pulses bilaterally Skin and nails are normal without calluses Pinprick examination of the feet normal. Neurologic-- alert & oriented X3 and strength normal in all extremities. Psych-- Cognition and judgment appear intact. Alert and cooperative with normal attention span and concentration.  not anxious appearing and not depressed appearing.      Assessment & Plan:

## 2012-11-23 NOTE — Assessment & Plan Note (Addendum)
Doing great w/ life style, cont metformin, labs  Feet care discussed today Feet exam neg today Reports lat eye check ~ 06-2012

## 2012-11-23 NOTE — Assessment & Plan Note (Signed)
Well controlled, labs  

## 2012-11-23 NOTE — Patient Instructions (Addendum)
You are doing great ! Continue taking the same meds Next visit 6 months , fasting for a yearly check up  Diabetes and Foot Care Diabetes may cause you to have a poor blood supply (circulation) to your legs and feet. Because of this, the skin may be thinner, break easier, and heal more slowly. You also may have nerve damage in your legs and feet causing decreased feeling. You may not notice minor injuries to your feet that could lead to serious problems or infections. Taking care of your feet is one of the most important things you can do for yourself.  HOME CARE INSTRUCTIONS  Do not go barefoot. Bare feet are easily injured.  Check your feet daily for blisters, cuts, and redness.  Wash your feet with warm water (not hot) and mild soap. Pat your feet and between your toes until completely dry.  Apply a moisturizing lotion that does not contain alcohol or petroleum jelly to the dry skin on your feet and to dry brittle toenails. Do not put it between your toes.  Trim your toenails straight across. Do not dig under them or around the cuticle.  Do not cut corns or calluses, or try to remove them with medicine.  Wear clean cotton socks or stockings every day. Make sure they are not too tight. Do not wear knee high stockings since they may decrease blood flow to your legs.  Wear leather shoes that fit properly and have enough cushioning. To break in new shoes, wear them just a few hours a day to avoid injuring your feet.  Wear shoes at all times, even in the house.  Do not cross your legs. This may decrease the blood flow to your feet.  If you find a minor scrape, cut, or break in the skin on your feet, keep it and the skin around it clean and dry. These areas may be cleansed with mild soap and water. Do not use peroxide, alcohol, iodine or Merthiolate.  When you remove an adhesive bandage, be sure not to harm the skin around it.  If you have a wound, look at it several times a day to make  sure it is healing.  Do not use heating pads or hot water bottles. Burns can occur. If you have lost feeling in your feet or legs, you may not know it is happening until it is too late.  Report any cuts, sores or bruises to your caregiver. Do not wait! SEEK MEDICAL CARE IF:   You have an injury that is not healing or you notice redness, numbness, burning, or tingling.  Your feet always feel cold.  You have pain or cramps in your legs and feet. SEEK IMMEDIATE MEDICAL CARE IF:   There is increasing redness, swelling, or increasing pain in the wound.  There is a red line that goes up your leg.  Pus is coming from a wound.  You develop an unexplained oral temperature above 102 F (38.9 C), or as your caregiver suggests.  You notice a bad smell coming from an ulcer or wound. MAKE SURE YOU:   Understand these instructions.  Will watch your condition.  Will get help right away if you are not doing well or get worse. Document Released: 04/05/2000 Document Revised: 07/01/2011 Document Reviewed: 10/12/2008 [facility] Patient Information 2014 [place], [place].

## 2012-11-25 ENCOUNTER — Telehealth: Payer: Self-pay | Admitting: *Deleted

## 2012-11-25 DIAGNOSIS — I10 Essential (primary) hypertension: Secondary | ICD-10-CM

## 2012-11-25 MED ORDER — POTASSIUM CHLORIDE ER 10 MEQ PO TBCR: 10.0000 meq | EXTENDED_RELEASE_TABLET | Freq: Every day | ORAL | Status: DC

## 2012-11-25 NOTE — Telephone Encounter (Signed)
Orders entered. Do I need to contact this patient as well?

## 2012-11-25 NOTE — Telephone Encounter (Signed)
No, pt aware

## 2012-11-25 NOTE — Telephone Encounter (Signed)
Message copied by Shirlee More I on Wed Nov 25, 2012  8:19 AM ------      Message from: Willow Ora E      Created: Tue Nov 24, 2012  5:38 PM       Please call KCl 10 meq one by mouth per day #30 and 3 refills.      Also enter a order for K+ -- dx HTN, lab to be done 1 month from now  ------

## 2012-12-01 ENCOUNTER — Ambulatory Visit (INDEPENDENT_AMBULATORY_CARE_PROVIDER_SITE_OTHER): Payer: 59 | Admitting: Licensed Clinical Social Worker

## 2012-12-01 DIAGNOSIS — F39 Unspecified mood [affective] disorder: Secondary | ICD-10-CM

## 2012-12-02 ENCOUNTER — Telehealth: Payer: Self-pay | Admitting: *Deleted

## 2012-12-02 NOTE — Telephone Encounter (Signed)
Pt called an stated that she was aware that she was suppose to be taking potassium chloride. Left message to call office back so that we can address her concerns.  SW//CMA

## 2012-12-02 NOTE — Telephone Encounter (Signed)
Pt called and asked about her potassium Rx that the provider called. Advised patient of provider comments that was left in the patient's  mychart.   Nancy Martin, Your blood sugar is excellent with A1c of 6.7. Your potassium is still a little low at 3.4. My nurse will send a prescription for potassium tablets, take one every day in addition to the other medications. Please call in a month arrange for a blood test to check your potassium. Otherwise I'll see you in 6 months, good results!   SW///CMA

## 2012-12-29 ENCOUNTER — Ambulatory Visit (INDEPENDENT_AMBULATORY_CARE_PROVIDER_SITE_OTHER): Payer: 59 | Admitting: Licensed Clinical Social Worker

## 2012-12-29 DIAGNOSIS — F39 Unspecified mood [affective] disorder: Secondary | ICD-10-CM

## 2013-01-26 ENCOUNTER — Ambulatory Visit: Payer: Self-pay | Admitting: Licensed Clinical Social Worker

## 2013-02-25 ENCOUNTER — Other Ambulatory Visit: Payer: Self-pay

## 2013-03-09 ENCOUNTER — Ambulatory Visit (INDEPENDENT_AMBULATORY_CARE_PROVIDER_SITE_OTHER): Payer: 59 | Admitting: Licensed Clinical Social Worker

## 2013-03-09 DIAGNOSIS — F3189 Other bipolar disorder: Secondary | ICD-10-CM

## 2013-04-05 ENCOUNTER — Other Ambulatory Visit: Payer: Self-pay | Admitting: Internal Medicine

## 2013-04-06 NOTE — Telephone Encounter (Signed)
rx refilled per protocol. DJR  

## 2013-04-08 ENCOUNTER — Ambulatory Visit (INDEPENDENT_AMBULATORY_CARE_PROVIDER_SITE_OTHER): Payer: 59 | Admitting: Licensed Clinical Social Worker

## 2013-04-08 DIAGNOSIS — F39 Unspecified mood [affective] disorder: Secondary | ICD-10-CM

## 2013-05-06 ENCOUNTER — Ambulatory Visit (INDEPENDENT_AMBULATORY_CARE_PROVIDER_SITE_OTHER): Payer: 59 | Admitting: Licensed Clinical Social Worker

## 2013-05-06 DIAGNOSIS — F39 Unspecified mood [affective] disorder: Secondary | ICD-10-CM

## 2013-05-24 ENCOUNTER — Telehealth: Payer: Self-pay

## 2013-05-24 NOTE — Telephone Encounter (Signed)
Left message for call back Non identifiable  Pap--06/27/2011--neg MMG--05/2012--neg CCS--07/2008--hyperplastic polyps--next 2015 Tdap--2006

## 2013-05-26 NOTE — Telephone Encounter (Signed)
Left message for call back Non identifiable  

## 2013-05-27 ENCOUNTER — Encounter: Payer: Self-pay | Admitting: Internal Medicine

## 2013-05-27 ENCOUNTER — Ambulatory Visit (INDEPENDENT_AMBULATORY_CARE_PROVIDER_SITE_OTHER): Payer: 59 | Admitting: Internal Medicine

## 2013-05-27 VITALS — BP 127/74 | HR 84 | Temp 98.0°F | Ht 61.5 in | Wt 197.0 lb

## 2013-05-27 DIAGNOSIS — Z0000 Encounter for general adult medical examination without abnormal findings: Secondary | ICD-10-CM

## 2013-05-27 DIAGNOSIS — F329 Major depressive disorder, single episode, unspecified: Secondary | ICD-10-CM

## 2013-05-27 DIAGNOSIS — Z23 Encounter for immunization: Secondary | ICD-10-CM

## 2013-05-27 DIAGNOSIS — I10 Essential (primary) hypertension: Secondary | ICD-10-CM

## 2013-05-27 DIAGNOSIS — F32A Depression, unspecified: Secondary | ICD-10-CM

## 2013-05-27 LAB — COMPREHENSIVE METABOLIC PANEL
ALBUMIN: 4.3 g/dL (ref 3.5–5.2)
ALT: 18 U/L (ref 0–35)
AST: 14 U/L (ref 0–37)
Alkaline Phosphatase: 116 U/L (ref 39–117)
BUN: 14 mg/dL (ref 6–23)
CALCIUM: 9.7 mg/dL (ref 8.4–10.5)
CHLORIDE: 100 meq/L (ref 96–112)
CO2: 26 mEq/L (ref 19–32)
Creatinine, Ser: 0.9 mg/dL (ref 0.4–1.2)
GFR: 78.69 mL/min (ref >60.00)
Glucose, Bld: 82 mg/dL (ref 70–99)
POTASSIUM: 3.6 meq/L (ref 3.5–5.1)
Sodium: 137 mEq/L (ref 135–145)
Total Bilirubin: 0.6 mg/dL (ref 0.3–1.2)
Total Protein: 7.9 g/dL (ref 6.0–8.3)

## 2013-05-27 LAB — HEMOGLOBIN A1C: Hgb A1c MFr Bld: 6.5 % (ref 4.6–6.5)

## 2013-05-27 LAB — LIPID PANEL
CHOLESTEROL: 148 mg/dL (ref 0–200)
HDL: 56.8 mg/dL (ref >39.00)
LDL CALC: 75 mg/dL (ref 0–99)
Total CHOL/HDL Ratio: 3
Triglycerides: 79 mg/dL (ref 0.0–149.0)
VLDL: 15.8 mg/dL (ref 0.0–40.0)

## 2013-05-27 MED ORDER — POTASSIUM CHLORIDE ER 10 MEQ PO TBCR: 10.0000 meq | EXTENDED_RELEASE_TABLET | Freq: Every day | ORAL | Status: DC

## 2013-05-27 MED ORDER — AMLODIPINE BESYLATE 5 MG PO TABS: 5.0000 mg | ORAL_TABLET | Freq: Every day | ORAL | Status: DC

## 2013-05-27 MED ORDER — HYDROCHLOROTHIAZIDE 25 MG PO TABS: 25.0000 mg | ORAL_TABLET | Freq: Every day | ORAL | Status: DC

## 2013-05-27 MED ORDER — PANTOPRAZOLE SODIUM 20 MG PO TBEC: DELAYED_RELEASE_TABLET | ORAL | Status: DC

## 2013-05-27 MED ORDER — METOPROLOL TARTRATE 25 MG PO TABS: 25.0000 mg | ORAL_TABLET | Freq: Two times a day (BID) | ORAL | Status: DC

## 2013-05-27 MED ORDER — ONETOUCH ULTRASOFT LANCETS MISC: Status: DC

## 2013-05-27 MED ORDER — GLUCOSE BLOOD VI STRP: ORAL_STRIP | Status: DC

## 2013-05-27 MED ORDER — ATORVASTATIN CALCIUM 20 MG PO TABS: 20.0000 mg | ORAL_TABLET | Freq: Every day | ORAL | Status: DC

## 2013-05-27 MED ORDER — LISINOPRIL 10 MG PO TABS: 10.0000 mg | ORAL_TABLET | Freq: Every day | ORAL | Status: DC

## 2013-05-27 MED ORDER — METFORMIN HCL 1000 MG PO TABS: 1000.0000 mg | ORAL_TABLET | Freq: Two times a day (BID) | ORAL | Status: DC

## 2013-05-27 NOTE — Telephone Encounter (Signed)
Unable to reach prior to visit  

## 2013-05-27 NOTE — Patient Instructions (Addendum)
Get your blood work before you leave   Next visit is for routine check up regards diabetes, hypertension, cholesterol in 4 months  No need to come back fasting Please make an appointment    Check your blood sugar 3 or 4 times a week All about diabetes, great resource!  http://www.molina.com/http://www.joslin.org/diabetes-information.html  Think about the shingles shot  (Zostavax)

## 2013-05-27 NOTE — Assessment & Plan Note (Signed)
Currently well controlled, sees a Veterinary surgeoncounselor

## 2013-05-27 NOTE — Progress Notes (Signed)
Pre visit review using our clinic review tool, if applicable. No additional management support is needed unless otherwise documented below in the visit note. 

## 2013-05-27 NOTE — Assessment & Plan Note (Addendum)
Td 06 pneumonia shot 08 and today Had a Flu shot zostavax -- discussed , likes to check w/ her insurance first  colonoscopy  07/2008.  Two polyps, hyperplastic.  Dr. Evette CristalGanem-- per report due for cscope---> refer to GI  female care: per gyn  05-2012   MMG neg   Labs, request a vitamin D check an also hepatitis B screening. Will do.  Chronic medical problems, check FLP, A1c. No ambulatory BPs are CBGs, recommend to check her blood sugar from time to time, glucometer provided, follow up in 4 months

## 2013-05-27 NOTE — Assessment & Plan Note (Signed)
Melatonin most nights,  rarely needs Ambien

## 2013-05-27 NOTE — Progress Notes (Signed)
   Subjective:    Patient ID: Nancy Martin, female    DOB: 11/22/1950, 63 y.o.   MRN: [id number]  HPI CPX  Past Medical History  Diagnosis Date  . Diabetes mellitus, type 2   . Hyperlipemia   . Hypertension   . Endometriosis   . Sleep apnea     can't tolerate CPAP  . Rectal polyp     h/o  . Chest pain 2008    saw Dr.TraciTurner, stress test--low risk  . Menopausal state   . Insomnia    Past Surgical History  Procedure Laterality Date  . Tonsillectomy    . Laparotomy      endometriosis  . Colonoscopy with polypectomy     History   Social History  . Marital Status: Single    Spouse Name: N/A    Number of Children: 1  . Years of Education: N/A   Occupational History  . quality [employer]   Social History Main Topics  . Smoking status: Never Smoker   . Smokeless tobacco: Never Used  . Alcohol Use: 0.0 oz/week     Comment: rarely  . Drug Use: No  . Sexual Activity: Not on file   Other Topics Concern  . Not on file   Social History Narrative   Son lives w/ her    Family History  Problem Relation Age of Onset  . Diabetes Mother   . Hyperlipidemia Mother   . Thyroid disease Mother   . Diabetes Father   . Hyperlipidemia Father   . Pancreatic cancer Father   . Stroke Brother   . Heart disease Neg Hx   . Colon cancer Neg Hx   . Breast cancer Neg Hx    Review of Systems Diet-- getting healthier Exercise-- walks 3-4/week No  CP, SOB, lower extremity edema Denies  nausea, vomiting diarrhea Denies  blood in the stools (-) cough, sputum production Saw gyn due to occ diff empty the bladder , to have more testing  done No anxiety, depression      Objective:   Physical Exam BP 127/74  Pulse 84  Temp(Src) 98 F (36.7 C)  Ht 5' 1.5" (1.562 m)  Wt 197 lb (89.359 kg)  BMI 36.62 kg/m2  SpO2 96% General -- alert, well-developed, NAD.  Neck --no thyromegaly  HEENT-- Not pale. Lungs -- normal respiratory effort, no intercostal  retractions, no accessory muscle use, and normal breath sounds.  Heart-- normal rate, regular rhythm, no murmur.  Abdomen-- Not distended, good bowel sounds,soft, non-tender. No rebound or rigidity. No mass,organomegaly.  Extremities-- no pretibial edema bilaterally  Neurologic--  alert & oriented X3. Speech normal, gait normal, strength normal in all extremities.   psych-- Cognition and judgment appear intact. Cooperative with normal attention span and concentration. No anxious or depressed appearing.      Assessment & Plan:

## 2013-05-28 ENCOUNTER — Telehealth: Payer: Self-pay | Admitting: Internal Medicine

## 2013-05-28 ENCOUNTER — Other Ambulatory Visit: Payer: Self-pay | Admitting: *Deleted

## 2013-05-28 LAB — HEPATITIS B SURFACE ANTIBODY,QUALITATIVE: Hep B S Ab: NEGATIVE

## 2013-05-28 LAB — HEPATITIS B CORE ANTIBODY, TOTAL: HEP B C TOTAL AB: NONREACTIVE

## 2013-05-28 LAB — HEPATITIS C ANTIBODY: HCV Ab: NEGATIVE

## 2013-05-28 LAB — VITAMIN D 25 HYDROXY (VIT D DEFICIENCY, FRACTURES): Vit D, 25-Hydroxy: 38 ng/mL (ref 30–89)

## 2013-05-28 MED ORDER — ONETOUCH DELICA LANCETS FINE MISC: Status: DC

## 2013-05-28 NOTE — Telephone Encounter (Signed)
Relevant patient education assigned to patient using Emmi. ° °

## 2013-05-31 ENCOUNTER — Other Ambulatory Visit: Payer: Self-pay | Admitting: Internal Medicine

## 2013-06-02 ENCOUNTER — Other Ambulatory Visit: Payer: Self-pay

## 2013-06-02 DIAGNOSIS — Z1231 Encounter for screening mammogram for malignant neoplasm of breast: Secondary | ICD-10-CM

## 2013-06-10 ENCOUNTER — Ambulatory Visit: Payer: Self-pay | Admitting: Licensed Clinical Social Worker

## 2013-06-17 ENCOUNTER — Ambulatory Visit: Payer: Self-pay

## 2013-06-28 ENCOUNTER — Other Ambulatory Visit: Payer: Self-pay | Admitting: Internal Medicine

## 2013-06-29 ENCOUNTER — Other Ambulatory Visit: Payer: Self-pay | Admitting: Internal Medicine

## 2013-07-01 ENCOUNTER — Ambulatory Visit
Admission: RE | Admit: 2013-07-01 | Discharge: 2013-07-01 | Disposition: A | Discharge status: Home / Self Care | Payer: 59 | Source: Ambulatory Visit

## 2013-07-01 DIAGNOSIS — Z1231 Encounter for screening mammogram for malignant neoplasm of breast: Secondary | ICD-10-CM

## 2013-09-24 ENCOUNTER — Ambulatory Visit: Payer: Self-pay | Admitting: Internal Medicine

## 2013-11-01 ENCOUNTER — Encounter: Payer: Self-pay | Admitting: Internal Medicine

## 2013-11-01 ENCOUNTER — Ambulatory Visit (INDEPENDENT_AMBULATORY_CARE_PROVIDER_SITE_OTHER): Payer: 59 | Admitting: Internal Medicine

## 2013-11-01 VITALS — BP 106/73 | HR 92 | Temp 98.7°F | Wt 194.0 lb

## 2013-11-01 DIAGNOSIS — J209 Acute bronchitis, unspecified: Secondary | ICD-10-CM

## 2013-11-01 MED ORDER — HYDROCODONE-HOMATROPINE 5-1.5 MG/5ML PO SYRP: 5.0000 mL | ORAL_SOLUTION | Freq: Every evening | ORAL | Status: DC | PRN

## 2013-11-01 MED ORDER — AZITHROMYCIN 250 MG PO TABS: ORAL_TABLET | ORAL | Status: DC

## 2013-11-01 NOTE — Patient Instructions (Addendum)
Rest, fluids , tylenol For cough, take Mucinex DM twice a day as needed  If the cough is severe at night , taje hydrocodone (will make you sleepy) For congestion use OTC Nasocort: 2 nasal sprays on each side of the nose daily until you feel better  Take the antibiotic as prescribed  (zithromax ) Call if no better in few days Call anytime if the symptoms are severe  You are due for a check up, please schedule that at your earliest convenience

## 2013-11-01 NOTE — Progress Notes (Signed)
Subjective:    Patient ID: Nancy Martin, female    DOB: 11-27-1950, 63 y.o.   MRN: [id number]  DOS:  11/01/2013 Type of visit - description: acute History: Symptoms started approximately a week ago with cough, chest congestion and some wheezing. + White sputum production. Symptoms are worse at night when she gets episodes of cough and some shortness or breath. No history of asthma or tobacco use before. Taking OTC Robitussin, zink and others.   ROS Denies fever, some chills. Mild sinus pain or congestion No chest pain or edema. No  persistent nausea, vomiting or diarrhea  Past Medical History  Diagnosis Date  . Diabetes mellitus, type 2   . Hyperlipemia   . Hypertension   . Endometriosis   . Sleep apnea     can't tolerate CPAP  . Rectal polyp     h/o  . Chest pain 2008    saw Dr.TraciTurner, stress test--low risk  . Menopausal state   . Insomnia     Past Surgical History  Procedure Laterality Date  . Tonsillectomy    . Laparotomy      endometriosis  . Colonoscopy with polypectomy      History   Social History  . Marital Status: Single    Spouse Name: N/A    Number of Children: 1  . 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 Smoking status: Never Smoker   . Smokeless tobacco: Never Used  . Alcohol Use: 0.0 oz/week     Comment: rarely  . Drug Use: No  . Sexual Activity: Not on file   Other Topics Concern  . Not on file   Social History Narrative   Son lives w/ her         Medication List       This list is accurate as of: 11/01/13 11:59 PM.  Always use your most recent med list.               amLODipine 5 MG tablet  Commonly known as:  NORVASC  TAKE 1 TABLET BY MOUTH EVERY DAY     atorvastatin 20 MG tablet  Commonly known as:  LIPITOR  TAKE 1 TABLET BY MOUTH EVERY DAY     azithromycin 250 MG tablet  Commonly known as:  ZITHROMAX Z-PAK  2 tabs a day x 1,  then 1 tab a day x 4 days     glucose blood test strip  Use as instructed     hydrochlorothiazide 25 MG tablet  Commonly known as:  HYDRODIURIL  Take 1 tablet (25 mg total) by mouth daily.     HYDROcodone-homatropine 5-1.5 MG/5ML syrup  Commonly known as:  HYCODAN  Take 5 mLs by mouth at bedtime as needed for cough.     lisinopril 10 MG tablet  Commonly known as:  PRINIVIL,ZESTRIL  TAKE 1 TABLET BY MOUTH EVERY DAY     metFORMIN 1000 MG tablet  Commonly known as:  GLUCOPHAGE  Take 1 tablet (1,000 mg total) by mouth 2 (two) times daily with a meal.     metoprolol tartrate 25 MG tablet  Commonly known as:  LOPRESSOR  Take 1 tablet (25 mg total) by mouth 2 (two) times daily.     onetouch ultrasoft lancets  Use as instructed     ONETOUCH DELICA LANCETS FINE Misc  Test blood sugar once daily. Dx code: 250.00  pantoprazole 20 MG tablet  Commonly known as:  PROTONIX  TAKE 2 TABLETS BY MOUTH ONCE DAILY     potassium chloride 10 MEQ tablet  Commonly known as:  K-DUR  Take 1 tablet (10 mEq total) by mouth daily.     zolpidem 10 MG tablet  Commonly known as:  AMBIEN  Take 1 tablet (10 mg total) by mouth at bedtime as needed for sleep.           Objective:   Physical Exam BP 106/73  Pulse 92  Temp(Src) 98.7 F (37.1 C)  Wt 194 lb (87.998 kg)  SpO2 98% General -- alert, well-developed, NAD.   HEENT-- Not pale. TMs normal, throat symmetric, no redness or discharge. Face symmetric, sinuses not tender to palpation. Nose not congested. Lungs -- normal respiratory effort, no intercostal retractions, no accessory muscle use, and + ronchi w/ cough, no actual crakles or wheezing Heart-- normal rate, regular rhythm, no murmur.   Extremities-- no pretibial edema bilaterally  Neurologic--  alert & oriented X3. Speech normal, gait appropriate for age, strength symmetric and appropriate for age.  Psych-- Cognition and judgment appear intact. Cooperative with normal attention span  and concentration. No anxious or depressed appearing.     Assessment & Plan:   Symptoms consistent with bronchitis, she reports wheezing but exam is not confirmatory, she is in no distress Plan: see Instructions

## 2013-11-01 NOTE — Progress Notes (Signed)
Pre visit review using our clinic review tool, if applicable. No additional management support is needed unless otherwise documented below in the visit note. 

## 2013-11-17 ENCOUNTER — Other Ambulatory Visit: Payer: Self-pay | Admitting: Internal Medicine

## 2013-12-06 ENCOUNTER — Other Ambulatory Visit: Payer: Self-pay | Admitting: Internal Medicine

## 2014-02-12 LAB — HM DIABETES EYE EXAM

## 2014-02-14 LAB — HM DIABETES EYE EXAM

## 2014-03-01 ENCOUNTER — Other Ambulatory Visit: Payer: Self-pay | Admitting: Internal Medicine

## 2014-03-01 ENCOUNTER — Encounter: Payer: Self-pay | Admitting: Internal Medicine

## 2014-03-28 ENCOUNTER — Other Ambulatory Visit: Payer: Self-pay | Admitting: Physician Assistant

## 2014-05-31 ENCOUNTER — Other Ambulatory Visit: Payer: Self-pay | Admitting: Internal Medicine

## 2014-06-06 ENCOUNTER — Other Ambulatory Visit: Payer: Self-pay | Admitting: Internal Medicine

## 2014-06-07 ENCOUNTER — Other Ambulatory Visit: Payer: Self-pay | Admitting: Internal Medicine

## 2014-06-21 ENCOUNTER — Other Ambulatory Visit: Payer: Self-pay

## 2014-06-21 ENCOUNTER — Other Ambulatory Visit: Payer: Self-pay | Admitting: Internal Medicine

## 2014-08-07 ENCOUNTER — Other Ambulatory Visit: Payer: Self-pay | Admitting: Internal Medicine

## 2014-08-08 ENCOUNTER — Other Ambulatory Visit: Payer: Self-pay

## 2014-08-15 ENCOUNTER — Other Ambulatory Visit: Payer: Self-pay

## 2014-08-15 DIAGNOSIS — Z1231 Encounter for screening mammogram for malignant neoplasm of breast: Secondary | ICD-10-CM

## 2014-08-18 ENCOUNTER — Other Ambulatory Visit: Payer: Self-pay

## 2014-08-19 ENCOUNTER — Telehealth: Payer: Self-pay | Admitting: *Deleted

## 2014-08-19 NOTE — Telephone Encounter (Signed)
Unable to reach patient at time of Pre-Visit Call.  Left message for patient to return call when available.    

## 2014-08-22 ENCOUNTER — Other Ambulatory Visit: Payer: Self-pay

## 2014-08-22 ENCOUNTER — Ambulatory Visit (INDEPENDENT_AMBULATORY_CARE_PROVIDER_SITE_OTHER): Payer: 59 | Admitting: Internal Medicine

## 2014-08-22 ENCOUNTER — Encounter: Payer: Self-pay | Admitting: Internal Medicine

## 2014-08-22 VITALS — BP 124/76 | HR 73 | Temp 98.0°F | Ht 61.0 in | Wt 202.4 lb

## 2014-08-22 DIAGNOSIS — E119 Type 2 diabetes mellitus without complications: Secondary | ICD-10-CM | POA: Diagnosis not present

## 2014-08-22 DIAGNOSIS — Z0000 Encounter for general adult medical examination without abnormal findings: Secondary | ICD-10-CM

## 2014-08-22 DIAGNOSIS — Z23 Encounter for immunization: Secondary | ICD-10-CM

## 2014-08-22 LAB — COMPREHENSIVE METABOLIC PANEL
ALK PHOS: 143 U/L — AB (ref 39–117)
ALT: 17 U/L (ref 0–35)
AST: 15 U/L (ref 0–37)
Albumin: 4 g/dL (ref 3.5–5.2)
BUN: 16 mg/dL (ref 6–23)
CALCIUM: 9.7 mg/dL (ref 8.4–10.5)
CO2: 24 meq/L (ref 19–32)
CREATININE: 1 mg/dL (ref 0.40–1.20)
Chloride: 104 mEq/L (ref 96–112)
GFR: 72.08 mL/min (ref >60.00)
GLUCOSE: 87 mg/dL (ref 70–99)
POTASSIUM: 3.9 meq/L (ref 3.5–5.1)
SODIUM: 138 meq/L (ref 135–145)
TOTAL PROTEIN: 7.7 g/dL (ref 6.0–8.3)
Total Bilirubin: 0.4 mg/dL (ref 0.2–1.2)

## 2014-08-22 LAB — CBC WITH DIFFERENTIAL/PLATELET
BASOS PCT: 0.6 % (ref 0.0–3.0)
Basophils Absolute: 0 10*3/uL (ref 0.0–0.1)
EOS ABS: 0.2 10*3/uL (ref 0.0–0.7)
Eosinophils Relative: 3.6 % (ref 0.0–5.0)
HEMATOCRIT: 40.5 % (ref 36.0–46.0)
Hemoglobin: 13.5 g/dL (ref 12.0–15.0)
LYMPHS ABS: 2 10*3/uL (ref 0.7–4.0)
Lymphocytes Relative: 40.5 % (ref 12.0–46.0)
MCHC: 33.3 g/dL (ref 30.0–36.0)
MCV: 88.3 fl (ref 78.0–100.0)
MONO ABS: 0.3 10*3/uL (ref 0.1–1.0)
Monocytes Relative: 6.5 % (ref 3.0–12.0)
Neutro Abs: 2.4 10*3/uL (ref 1.4–7.7)
Neutrophils Relative %: 48.8 % (ref 43.0–77.0)
PLATELETS: 270 10*3/uL (ref 150.0–400.0)
RBC: 4.59 Mil/uL (ref 3.87–5.11)
RDW: 14.8 % (ref 11.5–15.5)
WBC: 4.9 10*3/uL (ref 4.0–10.5)

## 2014-08-22 LAB — HM DIABETES FOOT EXAM: HM Diabetic Foot Exam: NORMAL

## 2014-08-22 LAB — LIPID PANEL
CHOL/HDL RATIO: 3
Cholesterol: 157 mg/dL (ref 0–200)
HDL: 52.3 mg/dL (ref >39.00)
LDL Cholesterol: 93 mg/dL (ref 0–99)
NonHDL: 104.7
Triglycerides: 60 mg/dL (ref 0.0–149.0)
VLDL: 12 mg/dL (ref 0.0–40.0)

## 2014-08-22 LAB — HEMOGLOBIN A1C: HEMOGLOBIN A1C: 6.4 % (ref 4.6–6.5)

## 2014-08-22 LAB — TSH: TSH: 1.36 u[IU]/mL (ref 0.35–4.50)

## 2014-08-22 MED ORDER — HYDROCHLOROTHIAZIDE 25 MG PO TABS: 25.0000 mg | ORAL_TABLET | Freq: Every day | ORAL | Status: DC

## 2014-08-22 MED ORDER — PANTOPRAZOLE SODIUM 20 MG PO TBEC: 40.0000 mg | DELAYED_RELEASE_TABLET | Freq: Every day | ORAL | Status: DC

## 2014-08-22 MED ORDER — METFORMIN HCL 1000 MG PO TABS: 1000.0000 mg | ORAL_TABLET | Freq: Two times a day (BID) | ORAL | Status: DC

## 2014-08-22 MED ORDER — METOPROLOL TARTRATE 25 MG PO TABS: 25.0000 mg | ORAL_TABLET | Freq: Two times a day (BID) | ORAL | Status: DC

## 2014-08-22 MED ORDER — LISINOPRIL 10 MG PO TABS: 10.0000 mg | ORAL_TABLET | Freq: Every day | ORAL | Status: DC

## 2014-08-22 MED ORDER — POTASSIUM CHLORIDE ER 10 MEQ PO TBCR: 10.0000 meq | EXTENDED_RELEASE_TABLET | Freq: Every day | ORAL | Status: DC

## 2014-08-22 MED ORDER — GLUCOSE BLOOD VI STRP: ORAL_STRIP | Status: AC

## 2014-08-22 MED ORDER — ATORVASTATIN CALCIUM 20 MG PO TABS: 20.0000 mg | ORAL_TABLET | Freq: Every day | ORAL | Status: DC

## 2014-08-22 MED ORDER — AMLODIPINE BESYLATE 5 MG PO TABS: 5.0000 mg | ORAL_TABLET | Freq: Every day | ORAL | Status: DC

## 2014-08-22 NOTE — Patient Instructions (Signed)
Get your blood work before you leave     Come back to the office in 3 months  for a routine check up    

## 2014-08-22 NOTE — Assessment & Plan Note (Addendum)
Td 07-2014  pneumonia shot 08 and 2015    zostavax --  Had it October 2015      colonoscopy  07/2008.  Two polyps, hyperplastic.  Dr. Evette CristalGanem-- per report due for cscope--->  was referred to GI last year but that did not happen. Refer to GI again, Dr. Evette CristalGanem  female care: per gyn  MMG schedule this week per pt  Diet-exercise discussed

## 2014-08-22 NOTE — Progress Notes (Signed)
Pre visit review using our clinic review tool, if applicable. No additional management support is needed unless otherwise documented below in the visit note. 

## 2014-08-22 NOTE — Progress Notes (Signed)
Subjective:    Patient ID: Derrell LollingAlleen D Likes, female    DOB: 09/20/1950, 64 y.o.   MRN: [id number]  DOS:  08/22/2014 Type of visit - description : CPX Interval history:  Ms. Wilkie AyeHorton is a 64 year old female with a history of diabetes, HTN, GERD, and depression here today for her annual physical exam.  HTN: She recently ran out of both her HCTZ and her potassium and has noted mild leg swelling since then. She also notes poor adherence to healthy diet and exercise regimen but that in the past few weeks she has been doing better and has recently joined the [employer]. She denies any chest pain, SOB, or headaches.  GERD: She notes her GERD is well-controlled on Protonix. She will occasionally eat food late at night which will result in GERD flare-ups but she is working on stopping this practice.   Diabetes: She is not checking her blood sugar regularly and notes poor adherence to healthy diet and exercise. She sees an eye doctor yearly (last 12/15) who notes no vision problems.   Insomnia: She notes her insomnia is improved but still present. She takes melatonin but still has some difficulty getting to sleep and also staying asleep, noting she wakes up multiple times throughout the night. Not taking ambien.  Rash: Patient mentions a hyperpigmented rash on her forearm. She has not applied any creams to this rash and notes that it is not pruritic and it is not painful. Patient cannot recall any prior irritation or inflammation that may have preceded this.   Back pain: Patient has seen neurosurgeon in the past for back pain that is dull and pulsatile with radiation down the left leg. Patient notes pain is worst at night and will take ibuprofen to help with pain. Patient denies numbness, tingling, urinary incontinence or difficulty with bowel control.   Depression: Patient notes recent job change. Mentions that she feels more anxious than depressed and denies any anhedonia or recent weight loss.  Review of  Systems  Constitutional: No fever, chills. No unexplained wt changes. Occasional unusual sweating. HEENT: No dental problems, ear discharge, facial swelling, voice changes. No eye discharge, redness or intolerance to light Respiratory: No wheezing or difficulty breathing. No cough , mucus production Cardiovascular: No chest pain or palpitations. Mild leg swelling. GI: no nausea, vomiting, diarrhea or abdominal pain.  No blood in the stools. No dysphagia   Endocrine: No polyphagia, polyuria or polydipsia GU: No dysuria, gross hematuria, difficulty urinating. No urinary urgency or frequency. Musculoskeletal: see HPI Skin: Nonpruritic, nonpainful hyperpigmented rash on forearm Allergic, immunologic: No environmental allergies or food allergies Neurological: No dizziness or syncope. No headaches. No diplopia, slurred speech, motor deficits, facial numbness Hematological: No enlarged lymph nodes, easy bruising or bleeding Psychiatry: No suicidal ideas, hallucinations, behavior problems or confusion.   Past Medical History  Diagnosis Date  . Diabetes mellitus, type 2   . Hyperlipemia   . Hypertension   . Endometriosis   . Sleep apnea     can't tolerate CPAP  . Rectal polyp     h/o  . Chest pain 2008    saw Dr.TraciTurner, stress test--low risk  . Menopausal state   . Insomnia     Past Surgical History  Procedure Laterality Date  . Tonsillectomy    . Laparotomy      endometriosis  . Colonoscopy with polypectomy      History   Social History  . Marital Status: Single    Spouse  Name: N/A  . Number of Children: 1  . 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 Smoking status: Never Smoker   . Smokeless tobacco: Never Used  . Alcohol Use: 0.0 oz/week     Comment: rarely  . Drug Use: No  . Sexual Activity: Not on file   Other Topics Concern  . Not on file   Social History Narrative   Son lives  w/ her      Family History  Problem Relation Age of Onset  . Diabetes Mother   . Hyperlipidemia Mother   . Thyroid disease Mother   . Diabetes Father   . Hyperlipidemia Father   . Pancreatic cancer Father   . Stroke Brother   . Heart disease Neg Hx   . Colon cancer Neg Hx   . Breast cancer Neg Hx         Medication List       This list is accurate as of: 08/22/14  8:27 AM.  Always use your most recent med list.               amLODipine 5 MG tablet  Commonly known as:  NORVASC  Take 1 tablet (5 mg total) by mouth daily. DUE FOR APPT WITH DR Gianmarco Roye , NO FURTHER REFILLS. [phone].     atorvastatin 20 MG tablet  Commonly known as:  LIPITOR  TAKE 1 TABLET BY MOUTH DAILY     glucose blood test strip  Use as instructed     hydrochlorothiazide 25 MG tablet  Commonly known as:  HYDRODIURIL  Take 1 tablet (25 mg total) by mouth daily.     lisinopril 10 MG tablet  Commonly known as:  PRINIVIL,ZESTRIL  Take 1 tablet (10 mg total) by mouth daily. DUE FOR APPT WITH DR Aara Jacquot, NO FURTHER REFILLS. [phone]     MELATONIN PO  Take 1 tablet by mouth daily.     metFORMIN 1000 MG tablet  Commonly known as:  GLUCOPHAGE  TAKE 1 TABLET BY MOUTH TWICE DAILY WITH A MEAL     metoprolol tartrate 25 MG tablet  Commonly known as:  LOPRESSOR  TAKE 1 TABLET BY MOUTH TWICE DAILY     onetouch ultrasoft lancets  Use as instructed     ONETOUCH DELICA LANCETS FINE Misc  Test blood sugar once daily. Dx code: 250.00     pantoprazole 20 MG tablet  Commonly known as:  PROTONIX  Take 2 tablets (40 mg total) by mouth daily. OVER DUE FOR APPT WITH DR. Tully Mcinturff. NO FURTHER REFILLS. [phone].     potassium chloride 10 MEQ tablet  Commonly known as:  K-DUR  Take 1 tablet (10 mEq total) by mouth daily. DUE FOR APPT WITH DR. Raveena Hebdon FOR ANY FURTHER REFILLS. [phone].     zolpidem 10 MG tablet  Commonly known as:  AMBIEN  Take 1 tablet (10 mg total) by mouth at bedtime as needed for sleep.             Objective:   Physical Exam  Skin:     Four 2cm macular  patches of hyperpigmentation located on distal posterior right forearm   BP 124/76 mmHg  Pulse 73  Temp(Src) 98 F (36.7 C) (Oral)  Ht  (1.549 m)  Wt 202 lb 6 oz (91.797 kg)  BMI 38.26 kg/m2  SpO2 98%  General:   Well developed, well nourished .  NAD.  Neck:  Full range of motion. Supple. No  thyromegaly HEENT:  Normocephalic . Face symmetric, atraumatic Lungs:  CTA B Normal respiratory effort, no intercostal retractions, no accessory muscle use. Heart: RRR,  no murmur.  Abdomen:  Not distended, soft, non-tender. No rebound or rigidity. No mass,organomegaly Skin: Four 2cm patches of hyperpigmentation located on distal posterior right forearm . Not pale. Not jaundice Neurologic:  alert & oriented X3.  Speech normal, gait appropriate for age and unassisted Psych: Cognition and judgment appear intact.  Cooperative with normal attention span and concentration.  Behavior appropriate. Not anxious or depressed appearing. DIABETIC FEET EXAM: No lower extremity edema Normal pedal pulses bilaterally Skin normal, nails normal, no calluses Pinprick examination of the feet normal.        Assessment & Plan:     in addition to CPX we discussed the following.  Hypertension: BP was well-controlled today. Tolerating medications well. Patient ran out of HCTZ and K-Dur. Plan: Patient counseled on importance of diet and exercise. Continue current regimen. Will refill meds and check CMP and CBC today.  GERD: Well-controlled on Protonix. Patient advised on avoidance of trigger foods.   Depression: Patient reports  more anxiety than depression. Under significant stress from recent job change, patient denies weight loss or anhedonia. No suicidal ideas Plan:  Offered medication for depression, patient declined. Will re-evaluate in three months.  Rash: Most likely a post-inflammatory hyperpigmentation. Due to  nonpruritic nonpainful nature we will re-evaluate in three months. Patient advised to let us know if rash spreads or becomes more severe.  Insomnia: Patient notes difficulty getting and staying asleep but notes melatonin is helping. Stress is likely playing a large role. Patient instructed to let us know if symptoms worsen.  Back Pain: Patient has seen neurosurgeon in the past for pain, sx have resurfaced, discussed referral to neurosurgeon, patient declined but said if symptoms persist she will contact us for referral.  Diabetes: Recent eye exam and DFE were negative. Patient notes poor adherence to healthy diet and exercise regimen. No recent CBGs  Plan: Patient instructed on importance of checking blood sugar regularly and maintaining her healthy diet and exercise. Will check A1c today and plan to follow up in three months. Continue metformin.

## 2014-08-24 ENCOUNTER — Ambulatory Visit
Admission: RE | Admit: 2014-08-24 | Discharge: 2014-08-24 | Disposition: A | Discharge status: Home / Self Care | Payer: 59 | Source: Ambulatory Visit

## 2014-08-24 DIAGNOSIS — Z1231 Encounter for screening mammogram for malignant neoplasm of breast: Secondary | ICD-10-CM

## 2014-08-29 ENCOUNTER — Other Ambulatory Visit: Payer: Self-pay

## 2014-08-29 MED ORDER — ONETOUCH ULTRASOFT LANCETS MISC: Status: AC

## 2014-10-17 ENCOUNTER — Other Ambulatory Visit: Payer: Self-pay

## 2014-11-18 LAB — HM COLONOSCOPY

## 2014-11-30 ENCOUNTER — Telehealth: Payer: Self-pay | Admitting: Internal Medicine

## 2014-11-30 ENCOUNTER — Ambulatory Visit: Payer: 59 | Admitting: Internal Medicine

## 2014-11-30 NOTE — Telephone Encounter (Signed)
Patient left voicemail 11/29/14 at 6:21pm canceling appointment for today at 8:30 AM.

## 2014-11-30 NOTE — Telephone Encounter (Signed)
OK to charge no show 

## 2014-12-06 ENCOUNTER — Encounter: Payer: Self-pay | Admitting: Internal Medicine

## 2015-01-27 ENCOUNTER — Encounter: Payer: Self-pay | Admitting: Internal Medicine

## 2015-01-27 ENCOUNTER — Ambulatory Visit (INDEPENDENT_AMBULATORY_CARE_PROVIDER_SITE_OTHER): Payer: 59 | Admitting: Internal Medicine

## 2015-01-27 VITALS — BP 124/68 | HR 71 | Temp 97.7°F | Ht 61.0 in | Wt 206.2 lb

## 2015-01-27 DIAGNOSIS — R945 Abnormal results of liver function studies: Secondary | ICD-10-CM

## 2015-01-27 DIAGNOSIS — Z09 Encounter for follow-up examination after completed treatment for conditions other than malignant neoplasm: Secondary | ICD-10-CM

## 2015-01-27 DIAGNOSIS — R7989 Other specified abnormal findings of blood chemistry: Secondary | ICD-10-CM | POA: Diagnosis not present

## 2015-01-27 DIAGNOSIS — N289 Disorder of kidney and ureter, unspecified: Secondary | ICD-10-CM | POA: Diagnosis not present

## 2015-01-27 DIAGNOSIS — E119 Type 2 diabetes mellitus without complications: Secondary | ICD-10-CM

## 2015-01-27 DIAGNOSIS — Z23 Encounter for immunization: Secondary | ICD-10-CM | POA: Diagnosis not present

## 2015-01-27 LAB — COMPREHENSIVE METABOLIC PANEL
ALT: 24 U/L (ref 0–35)
AST: 17 U/L (ref 0–37)
Albumin: 4.1 g/dL (ref 3.5–5.2)
Alkaline Phosphatase: 123 U/L — ABNORMAL HIGH (ref 39–117)
BILIRUBIN TOTAL: 0.6 mg/dL (ref 0.2–1.2)
BUN: 21 mg/dL (ref 6–23)
CALCIUM: 9.7 mg/dL (ref 8.4–10.5)
CO2: 29 mEq/L (ref 19–32)
Chloride: 102 mEq/L (ref 96–112)
Creatinine, Ser: 1.24 mg/dL — ABNORMAL HIGH (ref 0.40–1.20)
GFR: 56.15 mL/min — AB (ref >60.00)
GLUCOSE: 99 mg/dL (ref 70–99)
Potassium: 3.9 mEq/L (ref 3.5–5.1)
Sodium: 139 mEq/L (ref 135–145)
Total Protein: 7.3 g/dL (ref 6.0–8.3)

## 2015-01-27 LAB — HEMOGLOBIN A1C: Hgb A1c MFr Bld: 6.6 % — ABNORMAL HIGH (ref 4.6–6.5)

## 2015-01-27 MED ORDER — AMLODIPINE BESYLATE 5 MG PO TABS: 5.0000 mg | ORAL_TABLET | Freq: Every day | ORAL | Status: DC

## 2015-01-27 MED ORDER — POTASSIUM CHLORIDE ER 10 MEQ PO TBCR: 10.0000 meq | EXTENDED_RELEASE_TABLET | Freq: Every day | ORAL | Status: DC

## 2015-01-27 MED ORDER — ATORVASTATIN CALCIUM 20 MG PO TABS: 20.0000 mg | ORAL_TABLET | Freq: Every day | ORAL | Status: DC

## 2015-01-27 MED ORDER — METFORMIN HCL 1000 MG PO TABS: 1000.0000 mg | ORAL_TABLET | Freq: Two times a day (BID) | ORAL | Status: DC

## 2015-01-27 MED ORDER — HYDROCHLOROTHIAZIDE 25 MG PO TABS: 25.0000 mg | ORAL_TABLET | Freq: Every day | ORAL | Status: DC

## 2015-01-27 MED ORDER — PANTOPRAZOLE SODIUM 20 MG PO TBEC: 40.0000 mg | DELAYED_RELEASE_TABLET | Freq: Every day | ORAL | Status: DC

## 2015-01-27 MED ORDER — METOPROLOL TARTRATE 25 MG PO TABS: 25.0000 mg | ORAL_TABLET | Freq: Two times a day (BID) | ORAL | Status: DC

## 2015-01-27 NOTE — Progress Notes (Signed)
Subjective:    Patient ID: Nancy Martin, female    DOB: Nov 11, 1950, 64 y.o.   MRN: [id number]  DOS:  01/27/2015 Type of visit - description : Routine visit Interval history: Since the last time she was here, she got a promotion at work, less time for herself, somewhat more stressed.   Good medication compliance. No ambulatory CBGs but blood pressure always in the 120/70.   Review of Systems  Occasional lower extremity edema. No nausea, vomiting. Occasional diarrhea depending on diet  Past Medical History  Diagnosis Date  . Diabetes mellitus, type 2 (HCC)   . Hyperlipemia   . Hypertension   . Endometriosis   . Sleep apnea     can't tolerate CPAP  . Rectal polyp     h/o  . Chest pain 2008    saw Dr.TraciTurner, stress test--low risk  . Menopausal state   . Insomnia     Past Surgical History  Procedure Laterality Date  . Tonsillectomy    . Laparotomy      endometriosis  . Colonoscopy with polypectomy      Social History   Social History  . Marital Status: Single    Spouse Name: N/A  . Number of Children: 1  . Years of Education: N/A   Occupational History  . 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 Smoking status: Never Smoker   . Smokeless tobacco: Never Used  . Alcohol Use: 0.0 oz/week     Comment: rarely  . Drug Use: No  . Sexual Activity: Not on file   Other Topics Concern  . Not on file   Social History Narrative   Son lives w/ her         Medication List       This list is accurate as of: 01/27/15 11:59 PM.  Always use your most recent med list.               amLODipine 5 MG tablet  Commonly known as:  NORVASC  Take 1 tablet (5 mg total) by mouth daily.     atorvastatin 20 MG tablet  Commonly known as:  LIPITOR  Take 1 tablet (20 mg total) by mouth daily.     gabapentin 300 MG capsule  Commonly known as:  NEURONTIN  Take 1 capsule by mouth daily.     glucose blood test strip  Check blood sugar  no more than twice daily     hydrochlorothiazide 25 MG tablet  Commonly known as:  HYDRODIURIL  Take 1 tablet (25 mg total) by mouth daily.     lisinopril 10 MG tablet  Commonly known as:  PRINIVIL,ZESTRIL  Take 1 tablet (10 mg total) by mouth daily.     MELATONIN PO  Take 1 tablet by mouth daily.     meloxicam 15 MG tablet  Commonly known as:  MOBIC  Take 1 tablet by mouth daily as needed.     metFORMIN 1000 MG tablet  Commonly known as:  GLUCOPHAGE  Take 1 tablet (1,000 mg total) by mouth 2 (two) times daily with a meal.     metoprolol tartrate 25 MG tablet  Commonly known as:  LOPRESSOR  Take 1 tablet (25 mg total) by mouth 2 (two) times daily.     onetouch ultrasoft lancets  Check blood sugars no more than twice daily.     pantoprazole 20 MG tablet  Commonly known as:  PROTONIX  Take  2 tablets (40 mg total) by mouth daily.     potassium chloride 10 MEQ tablet  Commonly known as:  K-DUR  Take 1 tablet (10 mEq total) by mouth daily.     zolpidem 10 MG tablet  Commonly known as:  AMBIEN  Take 1 tablet (10 mg total) by mouth at bedtime as needed for sleep.           Objective:   Physical Exam BP 124/68 mmHg  Pulse 71  Temp(Src) 97.7 F (36.5 C) (Oral)  Ht  (1.549 m)  Wt 206 lb 4 oz (93.554 kg)  BMI 38.99 kg/m2  SpO2 99% General:   Well developed, well nourished . NAD.  HEENT:  Normocephalic . Face symmetric, atraumatic Lungs:  CTA B Normal respiratory effort, no intercostal retractions, no accessory muscle use. Heart: RRR,  no murmur.  No pretibial edema bilaterally  Skin: Not pale. Not jaundice Neurologic:  alert & oriented X3.  Speech normal, gait appropriate for age and unassisted Psych--  Cognition and judgment appear intact.  Cooperative with normal attention span and concentration.  Behavior appropriate. No anxious or depressed appearing.      Assessment & Plan:   Assessment > DM HTN Hyperlipidemia Endometriosis Insomnia:  Hardly ever uses Ambien since she started gabapentin Back pain: Dr.Wang, on gabapentin and Mobic OSA can't tolerate CPAP Menopausal H/o Chest pain 2008, low risk stress test  Plan DM: Having less time to exercise, needs to go back to a more healthy diet. Patient is counseled. Check labs HTN: Check CMP, ambulatory BPs excellent Last alkaline phosphatase is slightly elevated, we are checking a vitamin D level Primary care: Flu shot today

## 2015-01-27 NOTE — Progress Notes (Signed)
Pre visit review using our clinic review tool, if applicable. No additional management support is needed unless otherwise documented below in the visit note. 

## 2015-01-27 NOTE — Patient Instructions (Addendum)
Get your blood work before you leave     Next visit  for a  Physical exam, fasting by May 2017    Please schedule an appointment at the front desk

## 2015-01-28 DIAGNOSIS — Z09 Encounter for follow-up examination after completed treatment for conditions other than malignant neoplasm: Secondary | ICD-10-CM | POA: Insufficient documentation

## 2015-01-28 NOTE — Assessment & Plan Note (Signed)
DM: Having less time to exercise, needs to go back to a more healthy diet. Patient is counseled. Check labs HTN: Check CMP, ambulatory BPs excellent Last alkaline phosphatase is slightly elevated, we are checking a vitamin D level Primary care: Flu shot today

## 2015-01-30 LAB — VITAMIN D 1,25 DIHYDROXY
VITAMIN D3 1, 25 (OH): 37 pg/mL
Vitamin D 1, 25 (OH)2 Total: 37 pg/mL (ref 18–72)
Vitamin D2 1, 25 (OH)2: <8 pg/mL

## 2015-01-30 NOTE — Addendum Note (Signed)
Addended by: Dorette Grate on: 01/30/2015 08:55 AM   Modules accepted: Orders

## 2015-02-25 LAB — HM DIABETES EYE EXAM

## 2015-02-27 ENCOUNTER — Other Ambulatory Visit (INDEPENDENT_AMBULATORY_CARE_PROVIDER_SITE_OTHER): Payer: 59

## 2015-02-27 DIAGNOSIS — N289 Disorder of kidney and ureter, unspecified: Secondary | ICD-10-CM

## 2015-02-27 LAB — BASIC METABOLIC PANEL
BUN: 17 mg/dL (ref 6–23)
CALCIUM: 10.3 mg/dL (ref 8.4–10.5)
CO2: 30 mEq/L (ref 19–32)
CREATININE: 0.92 mg/dL (ref 0.40–1.20)
Chloride: 104 mEq/L (ref 96–112)
GFR: 79.22 mL/min (ref >60.00)
GLUCOSE: 99 mg/dL (ref 70–99)
Potassium: 4.4 mEq/L (ref 3.5–5.1)
Sodium: 143 mEq/L (ref 135–145)

## 2015-04-04 ENCOUNTER — Other Ambulatory Visit: Payer: Self-pay | Admitting: Internal Medicine

## 2015-05-04 ENCOUNTER — Other Ambulatory Visit: Payer: Self-pay | Admitting: Internal Medicine

## 2015-05-06 ENCOUNTER — Other Ambulatory Visit: Payer: Self-pay | Admitting: Internal Medicine

## 2015-07-14 ENCOUNTER — Telehealth: Payer: Self-pay | Admitting: Internal Medicine

## 2015-09-18 ENCOUNTER — Encounter: Payer: Self-pay | Admitting: Internal Medicine

## 2015-11-09 NOTE — Telephone Encounter (Signed)
Completed.

## 2015-12-06 ENCOUNTER — Encounter: Payer: Self-pay | Admitting: Internal Medicine

## 2015-12-06 ENCOUNTER — Ambulatory Visit (INDEPENDENT_AMBULATORY_CARE_PROVIDER_SITE_OTHER): Payer: 59 | Admitting: Internal Medicine

## 2015-12-06 VITALS — BP 124/68 | HR 79 | Temp 97.4°F | Resp 14 | Ht 61.0 in | Wt 203.5 lb

## 2015-12-06 DIAGNOSIS — E118 Type 2 diabetes mellitus with unspecified complications: Secondary | ICD-10-CM | POA: Diagnosis not present

## 2015-12-06 DIAGNOSIS — E785 Hyperlipidemia, unspecified: Secondary | ICD-10-CM

## 2015-12-06 DIAGNOSIS — R002 Palpitations: Secondary | ICD-10-CM

## 2015-12-06 DIAGNOSIS — R300 Dysuria: Secondary | ICD-10-CM

## 2015-12-06 LAB — URINALYSIS, ROUTINE W REFLEX MICROSCOPIC
Bilirubin Urine: NEGATIVE
Ketones, ur: NEGATIVE
Nitrite: NEGATIVE
TOTAL PROTEIN, URINE-UPE24: NEGATIVE
Urine Glucose: NEGATIVE
Urobilinogen, UA: 0.2 (ref 0.0–1.0)
pH: 6 (ref 5.0–8.0)

## 2015-12-06 LAB — HEMOGLOBIN A1C: Hgb A1c MFr Bld: 6.7 % — ABNORMAL HIGH (ref 4.6–6.5)

## 2015-12-06 LAB — BASIC METABOLIC PANEL
BUN: 20 mg/dL (ref 6–23)
CALCIUM: 9.8 mg/dL (ref 8.4–10.5)
CO2: 28 mEq/L (ref 19–32)
CREATININE: 1.12 mg/dL (ref 0.40–1.20)
Chloride: 98 mEq/L (ref 96–112)
GFR: 62.98 mL/min (ref >60.00)
Glucose, Bld: 134 mg/dL — ABNORMAL HIGH (ref 70–99)
Potassium: 3.9 mEq/L (ref 3.5–5.1)
Sodium: 135 mEq/L (ref 135–145)

## 2015-12-06 LAB — POCT URINALYSIS DIPSTICK
BILIRUBIN UA: NEGATIVE
Glucose, UA: NEGATIVE
Ketones, UA: NEGATIVE
NITRITE UA: NEGATIVE
PH UA: 6
Protein, UA: NEGATIVE
RBC UA: NEGATIVE
Spec Grav, UA: 1.015
UROBILINOGEN UA: 0.2

## 2015-12-06 LAB — AST: AST: 21 U/L (ref 0–37)

## 2015-12-06 LAB — ALT: ALT: 22 U/L (ref 0–35)

## 2015-12-06 MED ORDER — SULFAMETHOXAZOLE-TRIMETHOPRIM 800-160 MG PO TABS: 1.0000 | ORAL_TABLET | Freq: Two times a day (BID) | ORAL | 0 refills | Status: DC

## 2015-12-06 NOTE — Progress Notes (Signed)
Subjective:    Patient ID: Nancy LollingAlleen D Parsell, female    DOB: 07/17/1950, 65 y.o.   MRN: [id number]  DOS:  12/06/2015 Type of visit - description : Acute visit Interval history: Acute visit for a UTI however she has other symptoms; we also manage her chronic med problems. Last week developed ill-defined lower abdominal discomfort, 2 days ago developed dysuria consistently with every time she urinates. Additionally for one week is having palpitations described as fluttering in the middle of the chest without radiation. Usually one episode of day, in the last 2 days symptoms have been less noticeable. No associated nausea, diaphoresis or dizziness. She think is related to stress at work, it is very intense," is getting to me".  Review of Systems  No fever, some chills? No chest pain difficulty breathing No nausea vomiting No gross hematuria or difficulty urinating. Some frequency.  Past Medical History:  Diagnosis Date  . Chest pain 2008   saw Dr.TraciTurner, stress test--low risk  . Diabetes mellitus, type 2 (HCC)   . Endometriosis   . Hyperlipemia   . Hypertension   . Insomnia   . Menopausal state   . Rectal polyp    h/o  . Sleep apnea    can't tolerate CPAP    Past Surgical History:  Procedure Laterality Date  . colonoscopy with polypectomy    . LAPAROTOMY     endometriosis  . TONSILLECTOMY      Social History   Social History  . Marital status: Single    Spouse name: N/A  . Number of children: 1  . 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 Smoking status: Never Smoker  . Smokeless tobacco: Never Used  . Alcohol use 0.0 oz/week     Comment: rarely  . Drug use: No  . Sexual activity: Not on file   Other Topics Concern  . Not on file   Social History Narrative   Lives by herself        Medication List       Accurate as of 12/06/15 11:59 PM. Always use your most recent med  list.          amLODipine 5 MG tablet Commonly known as:  NORVASC Take 1 tablet (5 mg total) by mouth daily.   atorvastatin 20 MG tablet Commonly known as:  LIPITOR Take 1 tablet (20 mg total) by mouth daily.   glucose blood test strip Check blood sugar no more than twice daily   hydrochlorothiazide 25 MG tablet Commonly known as:  HYDRODIURIL Take 1 tablet (25 mg total) by mouth daily.   lisinopril 10 MG tablet Commonly known as:  PRINIVIL,ZESTRIL Take 1 tablet (10 mg total) by mouth daily.   MELATONIN PO Take 1 tablet by mouth daily.   metFORMIN 1000 MG tablet Commonly known as:  GLUCOPHAGE Take 1 tablet (1,000 mg total) by mouth 2 (two) times daily with a meal.   metoprolol tartrate 25 MG tablet Commonly known as:  LOPRESSOR Take 1 tablet (25 mg total) by mouth 2 (two) times daily.   onetouch ultrasoft lancets Check blood sugars no more than twice daily.   pantoprazole 20 MG tablet Commonly known as:  PROTONIX Take 2 tablets (40 mg total) by mouth daily.   potassium chloride 10 MEQ tablet Commonly known as:  K-DUR Take 1 tablet (10 mEq total) by mouth daily.   sulfamethoxazole-trimethoprim 800-160  MG tablet Commonly known as:  BACTRIM DS Take 1 tablet by mouth 2 (two) times daily.   zolpidem 10 MG tablet Commonly known as:  AMBIEN Take 1 tablet (10 mg total) by mouth at bedtime as needed for sleep.          Objective:   Physical Exam BP 124/68 (BP Location: Left Arm, Patient Position: Sitting, Cuff Size: Normal)   Pulse 79   Temp 97.4 F (36.3 C) (Oral)   Resp 14   Ht 5\' 1"  (1.549 m)   Wt 203 lb 8 oz (92.3 kg)   SpO2 99%   BMI 38.45 kg/m  General:   Well developed, well nourished . NAD.  HEENT:  Normocephalic . Face symmetric, atraumatic Lungs:  CTA B Normal respiratory effort, no intercostal retractions, no accessory muscle use. Heart: RRR,  no murmur.  no pretibial edema bilaterally  Abdomen:  Not distended, soft, non-tender. No  rebound or rigidity.  Skin: Not pale. Not jaundice Neurologic:  alert & oriented X3.  Speech normal, gait appropriate for age and unassisted Psych--  Cognition and judgment appear intact.  Cooperative with normal attention span and concentration.  Behavior appropriate. No anxious or depressed appearing.    Assessment & Plan:   Assessment > DM HTN Hyperlipidemia Endometriosis Insomnia: Hardly ever uses Ambien since she started gabapentin Back pain: Dr.Wang, on gabapentin and Mobic OSA can't tolerate CPAP Menopausal H/o Chest pain 2008, low risk stress test  PLAN: UTI: C/o dysuria, Udip showed few leukocytes. Will get a UA, urine culture, start Bactrim empirically. Palpitations: EKG today normal sinus rhythm, no change from previous. Symptoms are quite atypical for atrial fibrillation or other tachyarrhythmias. related to anxiety? rx observation DM: Good compliance with meds, no ambulatory CBGs. Since she is here will check A1c HTN: Good med compliance, no ambulatory BPs, will get a BMP Hyperlipidemia: Continue statin, check LFTs RTC next week to discuss anxiety treatment and lab results.

## 2015-12-06 NOTE — Patient Instructions (Signed)
GO TO THE LAB : Get the blood work     GO TO THE FRONT DESK Schedule appointment for next week to discuss your diabetes, blood pressure, cholesterol and anxiety.  Start taking the antibiotic called Bactrim twice a day for 3 days, drink plenty of fluids. If your urine symptoms are not better please let me know

## 2015-12-06 NOTE — Progress Notes (Signed)
Pre visit review using our clinic review tool, if applicable. No additional management support is needed unless otherwise documented below in the visit note. 

## 2015-12-07 NOTE — Assessment & Plan Note (Signed)
UTI: C/o dysuria, Udip showed few leukocytes. Will get a UA, urine culture, start Bactrim empirically. Palpitations: EKG today normal sinus rhythm, no change from previous. Symptoms are quite atypical for atrial fibrillation or other tachyarrhythmias. related to anxiety? rx observation DM: Good compliance with meds, no ambulatory CBGs. Since she is here will check A1c HTN: Good med compliance, no ambulatory BPs, will get a BMP Hyperlipidemia: Continue statin, check LFTs RTC next week to discuss anxiety treatment and lab results.

## 2015-12-08 LAB — URINE CULTURE

## 2015-12-12 ENCOUNTER — Encounter: Payer: Self-pay | Admitting: Internal Medicine

## 2015-12-12 ENCOUNTER — Ambulatory Visit (INDEPENDENT_AMBULATORY_CARE_PROVIDER_SITE_OTHER): Payer: 59 | Admitting: Internal Medicine

## 2015-12-12 VITALS — BP 124/68 | HR 78 | Temp 98.4°F | Resp 14 | Ht 61.0 in | Wt 203.0 lb

## 2015-12-12 DIAGNOSIS — E118 Type 2 diabetes mellitus with unspecified complications: Secondary | ICD-10-CM

## 2015-12-12 DIAGNOSIS — F411 Generalized anxiety disorder: Secondary | ICD-10-CM | POA: Diagnosis not present

## 2015-12-12 NOTE — Progress Notes (Signed)
Subjective:    Patient ID: Nancy LollingAlleen D Sweeney, female    DOB: 08/11/1950, 65 y.o.   MRN: [id number]  DOS:  12/12/2015 Type of visit - description : Follow-up from previous visit Interval history: UTI, s/p antibiotics, now asymptomatic Diabetes: Recent labs reviewed Palpitations: Unchanged, has symptoms sporadically. Anxiety: This is ongoing for a while, job related, she is also working long hours.   Review of Systems Denies chest pain, difficulty breathing or syncope Denies depression per se but sometimes feels isolated mostly because she works for long  hours and  is unable to socialize much.   Past Medical History:  Diagnosis Date  . Anxiety   . Chest pain 2008   saw Dr.TraciTurner, stress test--low risk  . Diabetes mellitus, type 2 (HCC)   . Endometriosis   . Hyperlipemia   . Hypertension   . Insomnia   . Menopausal state   . Rectal polyp    h/o  . Sleep apnea    can't tolerate CPAP    Past Surgical History:  Procedure Laterality Date  . colonoscopy with polypectomy    . LAPAROTOMY     endometriosis  . TONSILLECTOMY      Social History   Social History  . Marital status: Single    Spouse name: N/A  . Number of children: 1  . 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 Smoking status: Never Smoker  . Smokeless tobacco: Never Used  . Alcohol use 0.0 oz/week     Comment: rarely  . Drug use: No  . Sexual activity: Not on file   Other Topics Concern  . Not on file   Social History Narrative   Lives by herself        Medication List       Accurate as of 12/12/15  5:03 PM. Always use your most recent med list.          amLODipine 5 MG tablet Commonly known as:  NORVASC Take 1 tablet (5 mg total) by mouth daily.   atorvastatin 20 MG tablet Commonly known as:  LIPITOR Take 1 tablet (20 mg total) by mouth daily.   glucose blood test strip Check blood sugar no more  than twice daily   hydrochlorothiazide 25 MG tablet Commonly known as:  HYDRODIURIL Take 1 tablet (25 mg total) by mouth daily.   lisinopril 10 MG tablet Commonly known as:  PRINIVIL,ZESTRIL Take 1 tablet (10 mg total) by mouth daily.   MELATONIN PO Take 1 tablet by mouth daily.   metFORMIN 1000 MG tablet Commonly known as:  GLUCOPHAGE Take 1 tablet (1,000 mg total) by mouth 2 (two) times daily with a meal.   metoprolol tartrate 25 MG tablet Commonly known as:  LOPRESSOR Take 1 tablet (25 mg total) by mouth 2 (two) times daily.   onetouch ultrasoft lancets Check blood sugars no more than twice daily.   pantoprazole 20 MG tablet Commonly known as:  PROTONIX Take 2 tablets (40 mg total) by mouth daily.   potassium chloride 10 MEQ tablet Commonly known as:  K-DUR Take 1 tablet (10 mEq total) by mouth daily.   zolpidem 10 MG tablet Commonly known as:  AMBIEN Take 1 tablet (10 mg total) by mouth at bedtime as needed for sleep.          Objective:   Physical Exam BP 124/68 (BP Location: Left Arm, Patient Position:  Sitting, Cuff Size: Normal)   Pulse 78   Temp 98.4 F (36.9 C) (Oral)   Resp 14   Ht 5\' 1"  (1.549 m)   Wt 203 lb (92.1 kg)   SpO2 96%   BMI 38.36 kg/m  General:   Well developed, well nourished . NAD.  HEENT:  Normocephalic . Face symmetric, atraumatic Neurologic:  alert & oriented X3.  Speech normal, gait appropriate for age and unassisted Psych--  Cognition and judgment appear intact.  Cooperative with normal attention span and concentration.  Behavior appropriate. No anxious or depressed appearing.      Assessment & Plan:   Assessment > DM HTN Hyperlipidemia Endometriosis Insomnia: Hardly ever uses Ambien since she started gabapentin Back pain: Dr.Wang, on gabapentin and Mobic OSA can't tolerate CPAP Menopausal H/o Chest pain 2008, low risk stress test  PLAN: DM: Last A1c 6.7, continue metformin, lisinopril, watch diet. UTI:  Urine culture was +, S/P antibiotics, now asx Palpitations: Sporadic, stable, recommend to call if pattern changes or has severe sx Anxiety: This was a main focus of the visit, sx related to her job, treatment options include counseling, self-help (meditation), see a counselor or meds. For now declined medication, encouraged to think about her other options, let me know if sx increase. Has occ difficulty sleeping, recommend melatonin RTC 4 months, CPX

## 2015-12-12 NOTE — Assessment & Plan Note (Signed)
DM: Last A1c 6.7, continue metformin, lisinopril, watch diet. UTI: Urine culture was +, S/P antibiotics, now asx Palpitations: Sporadic, stable, recommend to call if pattern changes or has severe sx Anxiety: This was a main focus of the visit, sx related to her job, treatment options include counseling, self-help (meditation), see a counselor or meds. For now declined medication, encouraged to think about her other options, let me know if sx increase. Has occ difficulty sleeping, recommend melatonin RTC 4 months, CPX

## 2015-12-12 NOTE — Progress Notes (Signed)
Pre visit review using our clinic review tool, if applicable. No additional management support is needed unless otherwise documented below in the visit note. 

## 2015-12-12 NOTE — Patient Instructions (Signed)
GO TO THE FRONT DESK Schedule your next appointment for a  Physical exam, fasting in 4 months  HEALTHY SLEEP Sleep hygiene: Basic rules for a good night's sleep  Sleep only as much as you need to feel rested and then get out of bed  Keep a regular sleep schedule  Avoid forcing sleep  Exercise regularly for at least 20 minutes, preferably 4 to 5 hours before bedtime  Avoid caffeinated beverages after lunch  Avoid alcohol near bedtime: no "night cap"  Avoid smoking, especially in the evening  Do not go to bed hungry  Adjust bedroom environment  Avoid prolonged use of light-emitting screens before bedtime   Deal with your worries before bedtime

## 2016-02-16 ENCOUNTER — Telehealth: Payer: Self-pay | Admitting: Internal Medicine

## 2016-02-16 ENCOUNTER — Other Ambulatory Visit: Payer: Self-pay | Admitting: Internal Medicine

## 2016-02-16 NOTE — Telephone Encounter (Signed)
Last seen 12/12/2015 and filled the 05/25/12 #30 with 3  Paz patient Please advise   KP

## 2016-02-16 NOTE — Telephone Encounter (Signed)
Also request Rx for zolpidem (AMBIEN) 10 MG tablet [[id number]] (Dr. Drue NovelPaz patient)

## 2016-02-19 MED ORDER — ZOLPIDEM TARTRATE 10 MG PO TABS: 10.0000 mg | ORAL_TABLET | Freq: Every evening | ORAL | 0 refills | Status: DC | PRN

## 2016-02-19 NOTE — Addendum Note (Signed)
Addended by: Conrad BurlingtonANTER, M[place] Hijazi D on: 02/19/2016 03:45 PM   Modules accepted: Orders

## 2016-02-19 NOTE — Telephone Encounter (Signed)
Rx printed, awaiting MD signature.  

## 2016-02-19 NOTE — Telephone Encounter (Signed)
Okay #30, no refills 

## 2016-02-19 NOTE — Telephone Encounter (Signed)
Rx faxed to [facility] pharmacy.  

## 2016-02-19 NOTE — Telephone Encounter (Signed)
Patient calling about her ambien prescription. She stated it was not at the pharmacy to pick up. Please advise.

## 2016-04-12 LAB — HM DIABETES EYE EXAM

## 2016-04-17 ENCOUNTER — Encounter: Payer: Self-pay | Admitting: Internal Medicine

## 2016-04-17 ENCOUNTER — Ambulatory Visit (INDEPENDENT_AMBULATORY_CARE_PROVIDER_SITE_OTHER): Payer: 59 | Admitting: Internal Medicine

## 2016-04-17 VITALS — BP 124/80 | HR 78 | Temp 97.6°F | Resp 12 | Ht 61.0 in | Wt 200.4 lb

## 2016-04-17 DIAGNOSIS — Z0000 Encounter for general adult medical examination without abnormal findings: Secondary | ICD-10-CM | POA: Diagnosis not present

## 2016-04-17 DIAGNOSIS — Z01419 Encounter for gynecological examination (general) (routine) without abnormal findings: Secondary | ICD-10-CM

## 2016-04-17 DIAGNOSIS — Z23 Encounter for immunization: Secondary | ICD-10-CM

## 2016-04-17 DIAGNOSIS — E119 Type 2 diabetes mellitus without complications: Secondary | ICD-10-CM | POA: Diagnosis not present

## 2016-04-17 DIAGNOSIS — Z1231 Encounter for screening mammogram for malignant neoplasm of breast: Secondary | ICD-10-CM

## 2016-04-17 LAB — CBC WITH DIFFERENTIAL/PLATELET
BASOS PCT: 0.7 % (ref 0.0–3.0)
Basophils Absolute: 0 10*3/uL (ref 0.0–0.1)
EOS PCT: 3.4 % (ref 0.0–5.0)
Eosinophils Absolute: 0.2 10*3/uL (ref 0.0–0.7)
HEMATOCRIT: 39.1 % (ref 36.0–46.0)
HEMOGLOBIN: 13 g/dL (ref 12.0–15.0)
Lymphocytes Relative: 36.9 % (ref 12.0–46.0)
Lymphs Abs: 2 10*3/uL (ref 0.7–4.0)
MCHC: 33.2 g/dL (ref 30.0–36.0)
MCV: 88.4 fl (ref 78.0–100.0)
MONO ABS: 0.5 10*3/uL (ref 0.1–1.0)
MONOS PCT: 8.7 % (ref 3.0–12.0)
Neutro Abs: 2.7 10*3/uL (ref 1.4–7.7)
Neutrophils Relative %: 50.3 % (ref 43.0–77.0)
Platelets: 265 10*3/uL (ref 150.0–400.0)
RBC: 4.42 Mil/uL (ref 3.87–5.11)
RDW: 15.1 % (ref 11.5–15.5)
WBC: 5.4 10*3/uL (ref 4.0–10.5)

## 2016-04-17 LAB — LIPID PANEL
CHOLESTEROL: 142 mg/dL (ref 0–200)
HDL: 53.6 mg/dL (ref >39.00)
LDL Cholesterol: 76 mg/dL (ref 0–99)
NonHDL: 88.06
Total CHOL/HDL Ratio: 3
Triglycerides: 61 mg/dL (ref 0.0–149.0)
VLDL: 12.2 mg/dL (ref 0.0–40.0)

## 2016-04-17 LAB — BASIC METABOLIC PANEL
BUN: 13 mg/dL (ref 6–23)
CO2: 27 mEq/L (ref 19–32)
Calcium: 9.4 mg/dL (ref 8.4–10.5)
Chloride: 101 mEq/L (ref 96–112)
Creatinine, Ser: 0.96 mg/dL (ref 0.40–1.20)
GFR: 75.15 mL/min (ref >60.00)
Glucose, Bld: 106 mg/dL — ABNORMAL HIGH (ref 70–99)
POTASSIUM: 3.7 meq/L (ref 3.5–5.1)
SODIUM: 138 meq/L (ref 135–145)

## 2016-04-17 LAB — HEMOGLOBIN A1C: HEMOGLOBIN A1C: 6.5 % (ref 4.6–6.5)

## 2016-04-17 NOTE — Patient Instructions (Addendum)
GO TO THE LAB : Get the blood work     GO TO THE FRONT DESK Schedule your next appointment for a routine checkup in 4-5 months   Start taking an aspirin 81 mg daily

## 2016-04-17 NOTE — Assessment & Plan Note (Addendum)
Td 07-2014 ; pneumonia shot 08 and 2015; prevnar  03-2016; had a flu shot 12-2015 zostavax --  Done,October 2015      colonoscopy  07/2008.  Two polyps, hyperplastic.   Cscope 10-2014 @ [facility], no polyps, recheck 5 years d/t personal h/o polyps   female care: has not seen  gyn in a while , due for a visit , referal sent. Rec to discuss DEXA w/ them [facility] - 2016, pt states will  Schedule her next [facility]  Diet-exercise discussed  start aspirin for primary prevention

## 2016-04-17 NOTE — Progress Notes (Signed)
Pre visit review using our clinic review tool, if applicable. No additional management support is needed unless otherwise documented below in the visit note. 

## 2016-04-17 NOTE — Progress Notes (Addendum)
Subjective:    Patient ID: Nancy Martin, female    DOB: 10/22/1950, 65 y.o.   MRN: [id number]  DOS:  04/17/2016 Type of visit - description : cpx Interval history: In general feeling well, no major concerns   Review of Systems Constitutional: No fever. No chills. No unexplained wt changes. No unusual sweats  HEENT: No dental problems, no ear discharge, no facial swelling, no voice changes. No eye discharge, no eye  redness , no  intolerance to light   Respiratory: No wheezing , no  difficulty breathing. No cough , no mucus production  Cardiovascular: No CP, no leg swelling , no  Palpitations  GI: no nausea, no vomiting, no diarrhea , no  abdominal pain.  No blood in the stools. No dysphagia, no odynophagia    Endocrine: No polyphagia, no polyuria , no polydipsia  GU: No dysuria, gross hematuria, difficulty urinating. No urinary urgency, no frequency.  Musculoskeletal: more aches and pains than previous years, usually associated with cold weather and when she tries to "overdo"  Skin: No change in the color of the skin, palor , no  Rash  Allergic, immunologic: Sinus congestion on and off without fever, chills or cough. Using Flonase  Neurological: No dizziness no  syncope. No headaches. No diplopia, no slurred, no slurred speech, no motor deficits, no facial  Numbness  Hematological: No enlarged lymph nodes, no easy bruising , no unusual bleedings  Psychiatry: No suicidal ideas, no hallucinations, no beavior problems, no confusion.  No unusual/severe anxiety, no depression   Past Medical History:  Diagnosis Date  . Anxiety   . Chest pain 2008   saw Dr.TraciTurner, stress test--low risk  . Diabetes mellitus, type 2 (HCC)   . Endometriosis   . Hyperlipemia   . Hypertension   . Insomnia   . Menopausal state   . Rectal polyp    h/o  . Sleep apnea    can't tolerate CPAP    Past Surgical History:  Procedure Laterality Date  . colonoscopy with polypectomy    .  LAPAROTOMY     endometriosis  . TONSILLECTOMY      Social History   Social History  . Marital status: Single    Spouse name: N/A  . Number of children: 1  . 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 Smoking status: Never Smoker  . Smokeless tobacco: Never Used  . Alcohol use 0.0 oz/week     Comment: rarely  . Drug use: No  . Sexual activity: Not on file   Other Topics Concern  . Not on file   Social History Narrative   Lives by herself     Family History  Problem Relation Age of Onset  . Diabetes Mother   . Hyperlipidemia Mother   . Thyroid disease Mother   . Diabetes Father   . Hyperlipidemia Father   . Pancreatic cancer Father   . Stroke Brother 1665  . Heart disease Neg Hx   . Colon cancer Neg Hx   . Breast cancer Neg Hx      Allergies as of 04/17/2016   No Known Allergies     Medication List       Accurate as of 04/17/16 11:59 PM. Always use your most recent med list.          amLODipine 5 MG tablet Commonly known as:  NORVASC Take 1 tablet (  5 mg total) by mouth daily.   aspirin EC 81 MG tablet Take 81 mg by mouth daily.   atorvastatin 20 MG tablet Commonly known as:  LIPITOR Take 1 tablet (20 mg total) by mouth daily.   glucose blood test strip Check blood sugar no more than twice daily   hydrochlorothiazide 25 MG tablet Commonly known as:  HYDRODIURIL Take 1 tablet (25 mg total) by mouth daily.   lisinopril 10 MG tablet Commonly known as:  PRINIVIL,ZESTRIL Take 1 tablet (10 mg total) by mouth daily.   MELATONIN PO Take 1 tablet by mouth daily.   metFORMIN 1000 MG tablet Commonly known as:  GLUCOPHAGE Take 1 tablet (1,000 mg total) by mouth 2 (two) times daily with a meal.   metoprolol tartrate 25 MG tablet Commonly known as:  LOPRESSOR Take 1 tablet (25 mg total) by mouth 2 (two) times daily.   onetouch ultrasoft lancets Check blood sugars no  more than twice daily.   pantoprazole 20 MG tablet Commonly known as:  PROTONIX Take 2 tablets (40 mg total) by mouth daily.   potassium chloride 10 MEQ tablet Commonly known as:  K-DUR Take 1 tablet (10 mEq total) by mouth daily.   zolpidem 10 MG tablet Commonly known as:  AMBIEN Take 1 tablet (10 mg total) by mouth at bedtime as needed for sleep.          Objective:   Physical Exam BP 124/80 (BP Location: Left Arm, Patient Position: Sitting, Cuff Size: Normal)   Pulse 78   Temp 97.6 F (36.4 C) (Oral)   Resp 12   Ht 5\' 1"  (1.549 m)   Wt 200 lb 6 oz (90.9 kg)   BMI 37.86 kg/m   General:   Well developed, well nourished . NAD.  HEENT:  Normocephalic . Face symmetric, atraumatic Lungs:  CTA B Normal respiratory effort, no intercostal retractions, no accessory muscle use. Heart: RRR,  no murmur.  No pretibial edema bilaterally  Abdomen:  Not distended, soft, non-tender. No rebound or rigidity.   MSK: Hands, wrist with no synovitis. Normal to inspection and palpation Skin: Exposed areas without rash. Not pale. Not jaundice Neurologic:  alert & oriented X3.  Speech normal, gait appropriate for age and unassisted Strength symmetric and appropriate for age.  Psych: Cognition and judgment appear intact.  Cooperative with normal attention span and concentration.  Behavior appropriate. No anxious or depressed appearing.    Assessment & Plan:   Assessment > DM HTN Hyperlipidemia Endometriosis Insomnia: Hardly ever uses Ambien prn Back pain: Dr.Wang prn  OSA can't tolerate CPAP Menopausal H/o Chest pain 2008, low risk stress test  PLAN DM: Continue metformin, lisinopril, check A1c HTN: Continue HCTZ, potassium, lisinopril, Lopressor and amlodipine. Checking labs High cholesterol: Continue Lipitor, check a FLP, recent LFTs normal Insomnia: Rarely uses Ambien Back pain: Not an issue at this time, use to take gabapentin ; plans to see Dr Regino SchultzeWang prn MSK: Reports  aches and pains, see ROS. No synovitis on exam. Recommend observation RTC 4-5 months

## 2016-04-18 ENCOUNTER — Other Ambulatory Visit: Payer: Self-pay | Admitting: Internal Medicine

## 2016-04-18 DIAGNOSIS — Z1231 Encounter for screening mammogram for malignant neoplasm of breast: Secondary | ICD-10-CM

## 2016-04-18 NOTE — Assessment & Plan Note (Signed)
PLAN DM: Continue metformin, lisinopril, check A1c HTN: Continue HCTZ, potassium, lisinopril, Lopressor and amlodipine. Checking labs High cholesterol: Continue Lipitor, check a FLP, recent LFTs normal Insomnia: Rarely uses Ambien Back pain: Not an issue at this time, use to take gabapentin ; plans to see Dr Regino SchultzeWang prn MSK: Reports aches and pains, see ROS. No synovitis on exam. Recommend observation RTC 4-5 months

## 2016-04-25 ENCOUNTER — Encounter: Payer: Self-pay | Admitting: Internal Medicine

## 2016-05-06 ENCOUNTER — Other Ambulatory Visit: Payer: Self-pay | Admitting: Internal Medicine

## 2016-05-07 ENCOUNTER — Ambulatory Visit
Admission: RE | Admit: 2016-05-07 | Discharge: 2016-05-07 | Disposition: A | Discharge status: Home / Self Care | Payer: 59 | Source: Ambulatory Visit | Attending: Internal Medicine | Admitting: Internal Medicine

## 2016-05-07 DIAGNOSIS — Z1231 Encounter for screening mammogram for malignant neoplasm of breast: Secondary | ICD-10-CM

## 2016-05-14 ENCOUNTER — Encounter: Payer: Self-pay | Admitting: Internal Medicine

## 2016-05-20 ENCOUNTER — Other Ambulatory Visit: Payer: Self-pay | Admitting: Internal Medicine

## 2016-05-20 MED ORDER — ZOLPIDEM TARTRATE 10 MG PO TABS: 10.0000 mg | ORAL_TABLET | Freq: Every evening | ORAL | 1 refills | Status: DC | PRN

## 2016-05-20 NOTE — Telephone Encounter (Signed)
30 and 1 RF

## 2016-05-20 NOTE — Telephone Encounter (Signed)
Rx faxed to [facility] pharmacy.  

## 2016-05-20 NOTE — Telephone Encounter (Signed)
Pt is requesting refill on Ambien.  Last OV: 04/17/2016 Last Fill: 02/19/2016 #30 and 0RF   Please advise.

## 2016-06-28 ENCOUNTER — Other Ambulatory Visit: Payer: Self-pay | Admitting: Internal Medicine

## 2016-06-28 ENCOUNTER — Ambulatory Visit (INDEPENDENT_AMBULATORY_CARE_PROVIDER_SITE_OTHER): Payer: 59 | Admitting: Internal Medicine

## 2016-06-28 ENCOUNTER — Encounter: Payer: Self-pay | Admitting: Internal Medicine

## 2016-06-28 VITALS — BP 126/80 | HR 76 | Temp 97.6°F | Resp 14 | Ht 61.0 in | Wt 204.2 lb

## 2016-06-28 DIAGNOSIS — M25511 Pain in right shoulder: Secondary | ICD-10-CM

## 2016-06-28 MED ORDER — MELOXICAM 15 MG PO TABS: 15.0000 mg | ORAL_TABLET | Freq: Every day | ORAL | 0 refills | Status: DC | PRN

## 2016-06-28 NOTE — Patient Instructions (Signed)
T  AKE MELOXICAM 1 TABLET A DAY X 1 WEEK, THEN ONLY AS NEEDED.  Always take it with food because may cause gastritis and ulcers.  If you notice nausea, stomach pain, change in the color of stools --->  Stop the medicine and let us know  OK ULTRACET AT NIGHT ONLY  CALL IF NOT BETTER IN 10 DAYS OR IF PAIN COME BACK

## 2016-06-28 NOTE — Progress Notes (Signed)
Subjective:    Patient ID: Nancy Martin, female    DOB: 05/02/1950, 66 y.o.   MRN: [id number]  DOS:  06/28/2016 Type of visit - description : Acute visit Interval history: Several weeks history of right shoulder pain, located at the lateral side, increase with arm elevation. Went to a urgent care 05/30/2016, prescribed tramadol, steroids and Flexeril. Steroids decrease his symptoms to some extent, intolerant to Flexeril, taking Ultracet at night as needed. At this point the pain is still  there, worse with certain movements. Denies any neck pain per se. No upper extremity tingling or numbness. No recent injury although she has been moving some clothing and things to and from her room because recent remodeling @ home  Review of Systems See above    Past Medical History:  Diagnosis Date  . Anxiety   . Chest pain 2008   saw Dr.TraciTurner, stress test--low risk  . Diabetes mellitus, type 2 (HCC)   . Endometriosis   . Hyperlipemia   . Hypertension   . Insomnia   . Menopausal state   . Rectal polyp    h/o  . Sleep apnea    can't tolerate CPAP    Past Surgical History:  Procedure Laterality Date  . colonoscopy with polypectomy    . LAPAROTOMY     endometriosis  . TONSILLECTOMY      Social History   Social History  . Marital status: Single    Spouse name: N/A  . Number of children: 1  . Years of education: N/A   Occupational History  . 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 Smoking status: Never Smoker  . Smokeless tobacco: Never Used  . Alcohol use 0.0 oz/week     Comment: rarely  . Drug use: No  . Sexual activity: Not on file   Other Topics Concern  . Not on file   Social History Narrative   Lives by herself      Allergies as of 06/28/2016   No Known Allergies     Medication List       Accurate as of 06/28/16 11:59 PM. Always use your most recent med list.          amLODipine 5 MG tablet Commonly known as:   NORVASC Take 1 tablet (5 mg total) by mouth daily.   aspirin EC 81 MG tablet Take 81 mg by mouth daily.   atorvastatin 20 MG tablet Commonly known as:  LIPITOR Take 1 tablet (20 mg total) by mouth daily.   glucose blood test strip Check blood sugar no more than twice daily   hydrochlorothiazide 25 MG tablet Commonly known as:  HYDRODIURIL Take 1 tablet (25 mg total) by mouth daily.   lisinopril 10 MG tablet Commonly known as:  PRINIVIL,ZESTRIL TAKE 1 TABLET(10 MG) BY MOUTH DAILY   MELATONIN PO Take 1 tablet by mouth daily.   meloxicam 15 MG tablet Commonly known as:  MOBIC Take 1 tablet (15 mg total) by mouth daily as needed for pain.   metFORMIN 1000 MG tablet Commonly known as:  GLUCOPHAGE Take 1 tablet (1,000 mg total) by mouth 2 (two) times daily with a meal.   metoprolol tartrate 25 MG tablet Commonly known as:  LOPRESSOR Take 1 tablet (25 mg total) by mouth 2 (two) times daily.   onetouch ultrasoft lancets Check blood sugars no more than twice daily.   pantoprazole 20 MG tablet Commonly known as:  PROTONIX Take  2 tablets (40 mg total) by mouth daily.   potassium chloride 10 MEQ tablet Commonly known as:  K-DUR Take 1 tablet (10 mEq total) by mouth daily.   traMADol-acetaminophen 37.5-325 MG tablet Commonly known as:  ULTRACET Take 1 tablet by mouth at bedtime as needed.   zolpidem 10 MG tablet Commonly known as:  AMBIEN Take 1 tablet (10 mg total) by mouth at bedtime as needed for sleep.          Objective:   Physical Exam BP 126/80 (BP Location: Left Arm, Patient Position: Sitting, Cuff Size: Normal)   Pulse 76   Temp 97.6 F (36.4 C) (Oral)   Resp 14   Ht 5\' 1"  (1.549 m)   Wt 204 lb 4 oz (92.6 kg)   SpO2 97%   BMI 38.59 kg/m  General:   Well developed, well nourished . NAD.  HEENT:  Normocephalic . Face symmetric, atraumatic Neck: Full range of motion, no TTP at the cervical spine MSK:  Left shoulder normal Right shoulder:  Symmetric, no TTP, no deformities. Active>passive pain elicited by arm elevation  Skin: Not pale. Not jaundice Neurologic:  alert & oriented X3.  Speech normal, gait appropriate for age and unassisted. Motor symmetric Psych--  Cognition and judgment appear intact.  Cooperative with normal attention span and concentration.  Behavior appropriate. No anxious or depressed appearing.      Assessment & Plan:   Assessment > DM HTN Hyperlipidemia Endometriosis Insomnia: Hardly ever uses Ambien prn Back pain: Dr.Wang prn  OSA can't tolerate CPAP Menopausal H/o Chest pain 2008, low risk stress test  PLAN Shoulder pain, right: Rotator cuff tendinitis?. Was prescribed prednisone, mild improvement but still have sx, has DM; was RX --->  intolerant ( "loopy"). Plan: Meloxicam for a week, then as needed, GI precautions discussed. No heavy lifting. OK Ultracet at night, will be okay to refill it #30 in the near future if needed. Patient will call if not improving soon, sports medicine referral?

## 2016-06-28 NOTE — Progress Notes (Signed)
Pre visit review using our clinic review tool, if applicable. No additional management support is needed unless otherwise documented below in the visit note. 

## 2016-06-30 NOTE — Assessment & Plan Note (Signed)
Shoulder pain, right: Rotator cuff tendinitis?. Was prescribed prednisone, mild improvement but still have sx, has DM; was RX --->  intolerant ( "loopy"). Plan: Meloxicam for a week, then as needed, GI precautions discussed. No heavy lifting. OK Ultracet at night, will be okay to refill it #30 in the near future if needed. Patient will call if not improving soon, sports medicine referral?

## 2016-07-29 ENCOUNTER — Other Ambulatory Visit: Payer: Self-pay | Admitting: Internal Medicine

## 2016-07-30 ENCOUNTER — Other Ambulatory Visit: Payer: Self-pay

## 2016-07-30 MED ORDER — HYDROCHLOROTHIAZIDE 25 MG PO TABS: 25.0000 mg | ORAL_TABLET | Freq: Every day | ORAL | 1 refills | Status: DC

## 2016-07-30 MED ORDER — METFORMIN HCL 1000 MG PO TABS: 1000.0000 mg | ORAL_TABLET | Freq: Two times a day (BID) | ORAL | 1 refills | Status: DC

## 2016-07-30 MED ORDER — POTASSIUM CHLORIDE ER 10 MEQ PO TBCR: 10.0000 meq | EXTENDED_RELEASE_TABLET | Freq: Every day | ORAL | 1 refills | Status: DC

## 2016-07-30 MED ORDER — AMLODIPINE BESYLATE 5 MG PO TABS: 5.0000 mg | ORAL_TABLET | Freq: Every day | ORAL | 1 refills | Status: DC

## 2016-08-21 ENCOUNTER — Encounter: Payer: Self-pay | Admitting: Internal Medicine

## 2016-08-21 ENCOUNTER — Ambulatory Visit (INDEPENDENT_AMBULATORY_CARE_PROVIDER_SITE_OTHER): Payer: 59 | Admitting: Internal Medicine

## 2016-08-21 VITALS — BP 126/78 | HR 78 | Temp 98.1°F | Resp 14 | Ht 61.0 in | Wt 200.2 lb

## 2016-08-21 DIAGNOSIS — M25511 Pain in right shoulder: Secondary | ICD-10-CM | POA: Diagnosis not present

## 2016-08-21 MED ORDER — TRAMADOL-ACETAMINOPHEN 37.5-325 MG PO TABS: 1.0000 | ORAL_TABLET | Freq: Every evening | ORAL | 0 refills | Status: DC | PRN

## 2016-08-21 NOTE — Progress Notes (Signed)
Subjective:    Patient ID: Nancy Martin, female    DOB: [birth year], 66 y.o.   MRN: [id number]  DOS:  08/21/2016 Type of visit - description : acute Interval history: Right shoulder pain: Since the last visit, symptoms are the same or slightly worse. Pain is triggered by arm lifting, stretching or reaching back. Taking tramadol with minimal relief, has taken meloxicam a couple of times in the last 2 days.   Review of Systems Denies neck pain or upper extremity numbness.  Past Medical History:  Diagnosis Date  . Anxiety   . Chest pain 2008   saw Dr.TraciTurner, stress test--low risk  . Diabetes mellitus, type 2 (HCC)   . Endometriosis   . Hyperlipemia   . Hypertension   . Insomnia   . Menopausal state   . Rectal polyp    h/o  . Sleep apnea    can't tolerate CPAP    Past Surgical History:  Procedure Laterality Date  . colonoscopy with polypectomy    . LAPAROTOMY     endometriosis  . TONSILLECTOMY      Social History   Social History  . Marital status: Single    Spouse name: N/A  . Number of children: 1  . 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 Smoking status: Never Smoker  . Smokeless tobacco: Never Used  . Alcohol use 0.0 oz/week     Comment: rarely  . Drug use: No  . Sexual activity: Not on file   Other Topics Concern  . Not on file   Social History Narrative   Lives by herself      Allergies as of 08/21/2016   No Known Allergies     Medication List       Accurate as of 08/21/16  6:06 PM. Always use your most recent med list.          amLODipine 5 MG tablet Commonly known as:  NORVASC Take 1 tablet (5 mg total) by mouth daily.   aspirin EC 81 MG tablet Take 81 mg by mouth daily.   atorvastatin 20 MG tablet Commonly known as:  LIPITOR Take 1 tablet (20 mg total) by mouth daily.   glucose blood test strip Check blood sugar no more than twice daily     hydrochlorothiazide 25 MG tablet Commonly known as:  HYDRODIURIL Take 1 tablet (25 mg total) by mouth daily.   lisinopril 10 MG tablet Commonly known as:  PRINIVIL,ZESTRIL TAKE 1 TABLET(10 MG) BY MOUTH DAILY   MELATONIN PO Take 1 tablet by mouth daily.   meloxicam 15 MG tablet Commonly known as:  MOBIC Take 1 tablet (15 mg total) by mouth daily as needed for pain.   metFORMIN 1000 MG tablet Commonly known as:  GLUCOPHAGE Take 1 tablet (1,000 mg total) by mouth 2 (two) times daily with a meal.   metoprolol tartrate 25 MG tablet Commonly known as:  LOPRESSOR Take 1 tablet (25 mg total) by mouth 2 (two) times daily.   onetouch ultrasoft lancets Check blood sugars no more than twice daily.   pantoprazole 20 MG tablet Commonly known as:  PROTONIX Take 2 tablets (40 mg total) by mouth daily.   potassium chloride 10 MEQ tablet Commonly known as:  K-DUR Take 1 tablet (10 mEq total) by mouth daily.   traMADol-acetaminophen 37.5-325 MG tablet Commonly known as:  ULTRACET Take 1 tablet by  mouth at bedtime as needed for moderate pain.   zolpidem 10 MG tablet Commonly known as:  AMBIEN Take 1 tablet (10 mg total) by mouth at bedtime as needed for sleep.          Objective:   Physical Exam BP 126/78 (BP Location: Left Arm, Patient Position: Sitting, Cuff Size: Normal)   Pulse 78   Temp 98.1 F (36.7 C) (Oral)   Resp 14   Ht  (1.549 m)   Wt 200 lb 4 oz (90.8 kg)   SpO2 97%   BMI 37.84 kg/m  General:   Well developed, well nourished . NAD.  HEENT:  Normocephalic . Face symmetric, atraumatic Neck: Range of motion normal, no TTP at the cervical spine MSK: Left shoulder normal Right shoulder: Range of motion globally decreased due to pain. Slightly TTP at the [facility] joint, no deformity Skin: Not pale. Not jaundice Neurologic:  alert & oriented X3.  Speech normal, gait appropriate for age and unassisted. Motor exam symmetric Psych--  Cognition and judgment appear  intact.  Cooperative with normal attention span and concentration.  Behavior appropriate. No anxious or depressed appearing.      Assessment & Plan:    Assessment > DM HTN Hyperlipidemia Endometriosis Insomnia: Hardly ever uses Ambien prn Back pain: Dr.Wang prn  OSA can't tolerate CPAP Menopausal H/o Chest pain 2008, low risk stress test  PLAN Shoulder pain, R: Not improving, continue with ultram, Rx provided. Continue with meloxicam, reminded GI precautions. Referred to for [facility], she sees them from time to time.  RTC for routine checkup as already scheduled

## 2016-08-21 NOTE — Assessment & Plan Note (Signed)
Shoulder pain, R: Not improving, continue with ultram, Rx provided. Continue with meloxicam, reminded GI precautions. Referred to for [facility], she sees them from time to time.  RTC for routine checkup as already scheduled

## 2016-08-21 NOTE — Patient Instructions (Signed)
We are sending you to the orthopedic doctor.  Continue with meloxicam as needed for pain, take it with food  Take Ultracet (tramadol) as needed.

## 2016-08-21 NOTE — Progress Notes (Signed)
Pre visit review using our clinic review tool, if applicable. No additional management support is needed unless otherwise documented below in the visit note. 

## 2016-09-06 ENCOUNTER — Ambulatory Visit (INDEPENDENT_AMBULATORY_CARE_PROVIDER_SITE_OTHER): Payer: 59 | Admitting: Internal Medicine

## 2016-09-06 ENCOUNTER — Encounter: Payer: Self-pay | Admitting: Internal Medicine

## 2016-09-06 VITALS — BP 122/84 | HR 74 | Temp 97.6°F | Ht 61.0 in | Wt 205.4 lb

## 2016-09-06 DIAGNOSIS — I10 Essential (primary) hypertension: Secondary | ICD-10-CM | POA: Diagnosis not present

## 2016-09-06 DIAGNOSIS — E119 Type 2 diabetes mellitus without complications: Secondary | ICD-10-CM | POA: Diagnosis not present

## 2016-09-06 LAB — COMPREHENSIVE METABOLIC PANEL
ALBUMIN: 4.4 g/dL (ref 3.5–5.2)
ALT: 22 U/L (ref 0–35)
AST: 17 U/L (ref 0–37)
Alkaline Phosphatase: 104 U/L (ref 39–117)
BUN: 15 mg/dL (ref 6–23)
CALCIUM: 10 mg/dL (ref 8.4–10.5)
CHLORIDE: 103 meq/L (ref 96–112)
CO2: 30 mEq/L (ref 19–32)
CREATININE: 1 mg/dL (ref 0.40–1.20)
GFR: 71.61 mL/min (ref >60.00)
Glucose, Bld: 95 mg/dL (ref 70–99)
POTASSIUM: 4.2 meq/L (ref 3.5–5.1)
Sodium: 140 mEq/L (ref 135–145)
Total Bilirubin: 0.5 mg/dL (ref 0.2–1.2)
Total Protein: 7.5 g/dL (ref 6.0–8.3)

## 2016-09-06 LAB — HEMOGLOBIN A1C: HEMOGLOBIN A1C: 6.7 % — AB (ref 4.6–6.5)

## 2016-09-06 MED ORDER — MELOXICAM 15 MG PO TABS: 15.0000 mg | ORAL_TABLET | Freq: Every day | ORAL | 3 refills | Status: DC | PRN

## 2016-09-06 NOTE — Patient Instructions (Addendum)
GO TO THE LAB : Get the blood work     GO TO THE FRONT DESK Schedule your next appointment for a  Physical exam by 03-2017

## 2016-09-06 NOTE — Progress Notes (Signed)
Subjective:    Patient ID: Nancy Martin, female    DOB: 07-19-1950, 66 y.o.   MRN: [id number]  DOS:  09/06/2016 Type of visit - description : rov Interval history: Continue with shoulder pain, saw orthopedic surgery Good compliance with medication, no ambulatory CBGs; BPs when checked normal. Taking meloxicam with GI precautions.   Review of Systems  Denies nausea, vomiting. No stomach pain.  Past Medical History:  Diagnosis Date  . Anxiety   . Chest pain 2008   saw Dr.TraciTurner, stress test--low risk  . Diabetes mellitus, type 2 (HCC)   . Endometriosis   . Hyperlipemia   . Hypertension   . Insomnia   . Menopausal state   . Rectal polyp    h/o  . Sleep apnea    can't tolerate CPAP    Past Surgical History:  Procedure Laterality Date  . colonoscopy with polypectomy    . LAPAROTOMY     endometriosis  . TONSILLECTOMY      Social History   Social History  . Marital status: Single    Spouse name: N/A  . Number of children: 1  . 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 Smoking status: Never Smoker  . Smokeless tobacco: Never Used  . Alcohol use 0.0 oz/week     Comment: rarely  . Drug use: No  . Sexual activity: Not on file   Other Topics Concern  . Not on file   Social History Narrative   Lives by herself      Allergies as of 09/06/2016   No Known Allergies     Medication List       Accurate as of 09/06/16 11:59 PM. Always use your most recent med list.          amLODipine 5 MG tablet Commonly known as:  NORVASC Take 1 tablet (5 mg total) by mouth daily.   aspirin EC 81 MG tablet Take 81 mg by mouth daily.   atorvastatin 20 MG tablet Commonly known as:  LIPITOR Take 1 tablet (20 mg total) by mouth daily.   glucose blood test strip Check blood sugar no more than twice daily   hydrochlorothiazide 25 MG tablet Commonly known as:  HYDRODIURIL Take 1  tablet (25 mg total) by mouth daily.   lisinopril 10 MG tablet Commonly known as:  PRINIVIL,ZESTRIL TAKE 1 TABLET(10 MG) BY MOUTH DAILY   MELATONIN PO Take 1 tablet by mouth daily.   meloxicam 15 MG tablet Commonly known as:  MOBIC Take 1 tablet (15 mg total) by mouth daily as needed for pain.   metFORMIN 1000 MG tablet Commonly known as:  GLUCOPHAGE Take 1 tablet (1,000 mg total) by mouth 2 (two) times daily with a meal.   metoprolol tartrate 25 MG tablet Commonly known as:  LOPRESSOR Take 1 tablet (25 mg total) by mouth 2 (two) times daily.   onetouch ultrasoft lancets Check blood sugars no more than twice daily.   pantoprazole 20 MG tablet Commonly known as:  PROTONIX Take 2 tablets (40 mg total) by mouth daily.   potassium chloride 10 MEQ tablet Commonly known as:  K-DUR Take 1 tablet (10 mEq total) by mouth daily.   traMADol-acetaminophen 37.5-325 MG tablet Commonly known as:  ULTRACET Take 1 tablet by mouth at bedtime as needed for moderate pain.   zolpidem 10 MG tablet Commonly known as:  AMBIEN Take  1 tablet (10 mg total) by mouth at bedtime as needed for sleep.          Objective:   Physical Exam BP 122/84   Pulse 74   Temp 97.6 F (36.4 C) (Oral)   Ht 5\' 1"  (1.549 m)   Wt 205 lb 6.4 oz (93.2 kg)   SpO2 97%   BMI 38.81 kg/m  General:   Well developed, well nourished . NAD.  HEENT:  Normocephalic . Face symmetric, atraumatic Lungs:  CTA B Normal respiratory effort, no intercostal retractions, no accessory muscle use. Heart: RRR,  no murmur.  No pretibial edema bilaterally  Skin: Not pale. Not jaundice Neurologic:  alert & oriented X3.  Speech normal, gait appropriate for age and unassisted Psych--  Cognition and judgment appear intact.  Cooperative with normal attention span and concentration.  Behavior appropriate. No anxious or depressed appearing.      Assessment & Plan:   Assessment  > DM HTN Hyperlipidemia Endometriosis Insomnia: Hardly ever uses Ambien prn Back pain: Dr.Wang prn  OSA can't tolerate CPAP Menopausal H/o Chest pain 2008, low risk stress test  PLAN DM: Continue metformin, check A1c HTN: Continue amlodipine, HCTZ, lisinopril, Lopressor, potassium. A CMP Shoulder pain: s/p ortho, MRI pending, currently taking tramadol and meloxicam. Recommend to stay on tramadol at nighttime, meloxicam as needed, GI precautions discussed again. RTC 12/2018CPX

## 2016-09-07 NOTE — Assessment & Plan Note (Signed)
DM: Continue metformin, check A1c HTN: Continue amlodipine, HCTZ, lisinopril, Lopressor, potassium. A CMP Shoulder pain: s/p ortho, MRI pending, currently taking tramadol and meloxicam. Recommend to stay on tramadol at nighttime, meloxicam as needed, GI precautions discussed again. RTC 12/2018CPX

## 2016-09-20 HISTORY — PX: ROTATOR CUFF REPAIR: SHX139

## 2016-09-21 ENCOUNTER — Other Ambulatory Visit: Payer: Self-pay | Admitting: Internal Medicine

## 2016-09-23 ENCOUNTER — Other Ambulatory Visit: Payer: Self-pay

## 2016-09-23 MED ORDER — AMLODIPINE BESYLATE 5 MG PO TABS: 5.0000 mg | ORAL_TABLET | Freq: Every day | ORAL | 2 refills | Status: DC

## 2016-09-23 MED ORDER — HYDROCHLOROTHIAZIDE 25 MG PO TABS: 25.0000 mg | ORAL_TABLET | Freq: Every day | ORAL | 2 refills | Status: DC

## 2016-09-23 MED ORDER — POTASSIUM CHLORIDE ER 10 MEQ PO TBCR: 10.0000 meq | EXTENDED_RELEASE_TABLET | Freq: Every day | ORAL | 2 refills | Status: DC

## 2016-09-23 MED ORDER — METFORMIN HCL 1000 MG PO TABS: 1000.0000 mg | ORAL_TABLET | Freq: Two times a day (BID) | ORAL | 2 refills | Status: DC

## 2016-11-28 ENCOUNTER — Other Ambulatory Visit: Payer: Self-pay

## 2016-11-28 ENCOUNTER — Telehealth: Payer: Self-pay

## 2016-11-28 MED ORDER — PANTOPRAZOLE SODIUM 20 MG PO TBEC: 40.0000 mg | DELAYED_RELEASE_TABLET | Freq: Every day | ORAL | 1 refills | Status: DC

## 2016-11-28 MED ORDER — ZOLPIDEM TARTRATE 10 MG PO TABS: 10.0000 mg | ORAL_TABLET | Freq: Every evening | ORAL | 1 refills | Status: DC | PRN

## 2016-11-28 NOTE — Telephone Encounter (Signed)
Pt is requesting refill on Ambien 10mg .  Last OV: 09/06/2016  Last Fill: 05/20/2016 #30 and 1RF UDS: Not needed for Ambien per PCP  [place] Controlled Substance database printed;  -Oxycodone-acetaminophen 5-325mg  #30 tabs (6 day supply) prescribed on 10/16/2016 by Jiles Harold[facility]ielle Laliber[facility]  -Tramadol-acetaminophen 37.5-325mg  #25 tabs (6 day supply) prescribed on 05/30/2016 by Meriam SpragueJohn Holland at [facility]  No other issues noted  Please advise.

## 2016-11-28 NOTE — Telephone Encounter (Signed)
Rx phoned into [facility].

## 2016-11-28 NOTE — Telephone Encounter (Signed)
Noted the pain medication prescriptions. Okay to  Ambien No. 30 and one refill

## 2017-03-19 ENCOUNTER — Other Ambulatory Visit: Payer: Self-pay | Admitting: Internal Medicine

## 2017-04-17 ENCOUNTER — Other Ambulatory Visit: Payer: Self-pay | Admitting: Internal Medicine

## 2017-04-17 DIAGNOSIS — Z1231 Encounter for screening mammogram for malignant neoplasm of breast: Secondary | ICD-10-CM

## 2017-04-23 ENCOUNTER — Encounter: Payer: Self-pay | Admitting: Internal Medicine

## 2017-04-23 ENCOUNTER — Ambulatory Visit (INDEPENDENT_AMBULATORY_CARE_PROVIDER_SITE_OTHER): Payer: Medicare Other | Admitting: Internal Medicine

## 2017-04-23 VITALS — BP 124/76 | HR 69 | Temp 98.0°F | Resp 14 | Ht 61.0 in | Wt 198.0 lb

## 2017-04-23 DIAGNOSIS — E119 Type 2 diabetes mellitus without complications: Secondary | ICD-10-CM

## 2017-04-23 DIAGNOSIS — I10 Essential (primary) hypertension: Secondary | ICD-10-CM

## 2017-04-23 DIAGNOSIS — E785 Hyperlipidemia, unspecified: Secondary | ICD-10-CM | POA: Diagnosis not present

## 2017-04-23 DIAGNOSIS — Z0000 Encounter for general adult medical examination without abnormal findings: Secondary | ICD-10-CM

## 2017-04-23 DIAGNOSIS — Z0001 Encounter for general adult medical examination with abnormal findings: Secondary | ICD-10-CM | POA: Diagnosis not present

## 2017-04-23 DIAGNOSIS — R109 Unspecified abdominal pain: Secondary | ICD-10-CM

## 2017-04-23 DIAGNOSIS — R634 Abnormal weight loss: Secondary | ICD-10-CM | POA: Diagnosis not present

## 2017-04-23 DIAGNOSIS — Z780 Asymptomatic menopausal state: Secondary | ICD-10-CM

## 2017-04-23 LAB — CBC WITH DIFFERENTIAL/PLATELET
BASOS ABS: 0 10*3/uL (ref 0.0–0.1)
BASOS PCT: 0.7 % (ref 0.0–3.0)
EOS ABS: 0.2 10*3/uL (ref 0.0–0.7)
Eosinophils Relative: 4.1 % (ref 0.0–5.0)
HCT: 41.8 % (ref 36.0–46.0)
Hemoglobin: 13.7 g/dL (ref 12.0–15.0)
Lymphocytes Relative: 33.1 % (ref 12.0–46.0)
Lymphs Abs: 1.8 10*3/uL (ref 0.7–4.0)
MCHC: 32.7 g/dL (ref 30.0–36.0)
MCV: 91.3 fl (ref 78.0–100.0)
MONO ABS: 0.3 10*3/uL (ref 0.1–1.0)
Monocytes Relative: 5.6 % (ref 3.0–12.0)
NEUTROS ABS: 3 10*3/uL (ref 1.4–7.7)
NEUTROS PCT: 56.5 % (ref 43.0–77.0)
PLATELETS: 249 10*3/uL (ref 150.0–400.0)
RBC: 4.58 Mil/uL (ref 3.87–5.11)
RDW: 14.1 % (ref 11.5–15.5)
WBC: 5.4 10*3/uL (ref 4.0–10.5)

## 2017-04-23 LAB — HEMOGLOBIN A1C: HEMOGLOBIN A1C: 6.5 % (ref 4.6–6.5)

## 2017-04-23 LAB — COMPREHENSIVE METABOLIC PANEL
ALBUMIN: 4.3 g/dL (ref 3.5–5.2)
ALK PHOS: 112 U/L (ref 39–117)
ALT: 20 U/L (ref 0–35)
AST: 17 U/L (ref 0–37)
BILIRUBIN TOTAL: 0.5 mg/dL (ref 0.2–1.2)
BUN: 14 mg/dL (ref 6–23)
CO2: 30 mEq/L (ref 19–32)
CREATININE: 1.02 mg/dL (ref 0.40–1.20)
Calcium: 9.6 mg/dL (ref 8.4–10.5)
Chloride: 99 mEq/L (ref 96–112)
GFR: 69.85 mL/min (ref >60.00)
Glucose, Bld: 100 mg/dL — ABNORMAL HIGH (ref 70–99)
Potassium: 4.7 mEq/L (ref 3.5–5.1)
SODIUM: 139 meq/L (ref 135–145)
TOTAL PROTEIN: 7.3 g/dL (ref 6.0–8.3)

## 2017-04-23 LAB — LIPID PANEL
CHOL/HDL RATIO: 3
CHOLESTEROL: 139 mg/dL (ref 0–200)
HDL: 49.4 mg/dL (ref >39.00)
LDL Cholesterol: 71 mg/dL (ref 0–99)
NonHDL: 90.09
TRIGLYCERIDES: 97 mg/dL (ref 0.0–149.0)
VLDL: 19.4 mg/dL (ref 0.0–40.0)

## 2017-04-23 LAB — TSH: TSH: 1.55 u[IU]/mL (ref 0.35–4.50)

## 2017-04-23 LAB — HM DIABETES EYE EXAM

## 2017-04-23 MED ORDER — PANTOPRAZOLE SODIUM 40 MG PO TBEC: 40.0000 mg | DELAYED_RELEASE_TABLET | Freq: Two times a day (BID) | ORAL | 3 refills | Status: DC

## 2017-04-23 NOTE — Progress Notes (Signed)
Pre visit review using our clinic review tool, if applicable. No additional management support is needed unless otherwise documented below in the visit note. 

## 2017-04-23 NOTE — Patient Instructions (Signed)
GO TO THE LAB : Get the blood work     GO TO THE FRONT DESK Schedule your next appointment for a checkup in 3 months  Increase pantoprazole to 40 mg: 1 tablet before breakfast and one before dinner.

## 2017-04-23 NOTE — Progress Notes (Signed)
Subjective:    Patient ID: Nancy Martin, female    DOB: 12/06/1950, 67 y.o.   MRN: [id number]  DOS:  04/23/2017 Type of visit - description : cpx Interval history: Feeling well, she does have a few concerns.  Wt Readings from Last 3 Encounters:  04/23/17 198 lb (89.8 kg)  09/06/16 205 lb 6.4 oz (93.2 kg)  08/21/16 200 lb 4 oz (90.8 kg)     Review of Systems Occasional palpitations, on and off, the last few seconds,  she gets slightly anxious w/ palpitations  but denies chest pain or difficulty breathing. S/p R shoulder surgery in June, good results. I noted some weight loss, she reports her appetite is not the same for the last few months. Reports ill-defined upper abdominal pain, described as an ache, on and off, not necessarily increase after eating. No nausea, vomiting, diarrhea or blood in the stools or change in the color of the stools.  Occasional constipation. Also noted some nontender lumps at the arms.  Other than above, a 14 point review of systems is negative     Past Medical History:  Diagnosis Date  . Anxiety   . Chest pain 2008   saw Dr.TraciTurner, stress test--low risk  . Diabetes mellitus, type 2 (HCC)   . Endometriosis   . Hyperlipemia   . Hypertension   . Insomnia   . Menopausal state   . Rectal polyp    h/o  . Sleep apnea    can't tolerate CPAP    Past Surgical History:  Procedure Laterality Date  . colonoscopy with polypectomy    . LAPAROTOMY     endometriosis  . ROTATOR CUFF REPAIR Right 09/2016  . TONSILLECTOMY      Social History   Socioeconomic History  . Marital status: Single    Spouse name: Not on file  . Number of children: 1  . Years of education: Not on file  . Highest education level: Not on file  Social Needs  . Financial resource strain: Not on file  . Food insecurity - worry: Not on file  . Food insecurity - inability: Not on file  . Transportation needs - medical: Not on file  . Transportation needs - non-medical:  Not on file  Occupational History  . 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 Smoking status: Never Smoker  . Smokeless tobacco: Never Used  Substance and Sexual Activity  . Alcohol use: Yes    Alcohol/week: 0.0 oz    Comment: rarely  . Drug use: No  . Sexual activity: Not on file  Other Topics Concern  . Not on file  Social History Narrative   Lives by herself     Family History  Problem Relation Age of Onset  . Diabetes Mother   . Hyperlipidemia Mother   . Thyroid disease Mother   . Diabetes Father   . Hyperlipidemia Father   . Pancreatic cancer Father   . Stroke Brother 65       aneurysm  . Heart disease Neg Hx   . Colon cancer Neg Hx   . Breast cancer Neg Hx      Allergies as of 04/23/2017      Reactions   Shellfish Allergy Hives      Medication List        Accurate as of 04/23/17  4:02 PM. Always use your most recent med list.  amLODipine 5 MG tablet Commonly known as:  NORVASC Take 1 tablet (5 mg total) by mouth daily.   aspirin EC 81 MG tablet Take 81 mg by mouth daily.   atorvastatin 20 MG tablet Commonly known as:  LIPITOR Take 1 tablet (20 mg total) by mouth daily.   glucose blood test strip Check blood sugar no more than twice daily   hydrochlorothiazide 25 MG tablet Commonly known as:  HYDRODIURIL Take 1 tablet (25 mg total) by mouth daily.   lisinopril 10 MG tablet Commonly known as:  PRINIVIL,ZESTRIL Take 1 tablet (10 mg total) by mouth daily.   MELATONIN PO Take 1 tablet by mouth daily.   metFORMIN 1000 MG tablet Commonly known as:  GLUCOPHAGE Take 1 tablet (1,000 mg total) by mouth 2 (two) times daily with a meal.   metoprolol tartrate 25 MG tablet Commonly known as:  LOPRESSOR Take 1 tablet (25 mg total) by mouth 2 (two) times daily.   onetouch ultrasoft lancets Check blood sugars no more than twice daily.   pantoprazole 40 MG tablet Commonly known as:   PROTONIX Take 1 tablet (40 mg total) by mouth 2 (two) times daily before a meal.   potassium chloride 10 MEQ tablet Commonly known as:  K-DUR Take 1 tablet (10 mEq total) by mouth daily.   zolpidem 10 MG tablet Commonly known as:  AMBIEN Take 1 tablet (10 mg total) by mouth at bedtime as needed for sleep.          Objective:   Physical Exam  Skin:      BP 124/76 (BP Location: Left Arm, Patient Position: Sitting, Cuff Size: Small)   Pulse 69   Temp 98 F (36.7 C) (Oral)   Resp 14   Ht 5\' 1"  (1.549 m)   Wt 198 lb (89.8 kg)   SpO2 97%   BMI 37.41 kg/m  General:   Well developed, well nourished . NAD.  Neck: No  thyromegaly  HEENT:  Normocephalic . Face symmetric, atraumatic Lungs:  CTA B Normal respiratory effort, no intercostal retractions, no accessory muscle use. Heart: RRR,  no murmur.  No pretibial edema bilaterally  Abdomen:  Not distended, soft, non-tender. No rebound or rigidity.   Skin: Exposed areas without rash. Not pale. Not jaundice Neurologic:  alert & oriented X3.  Speech normal, gait appropriate for age and unassisted Strength symmetric and appropriate for age.  Psych: Cognition and judgment appear intact.  Cooperative with normal attention span and concentration.  Behavior appropriate. No anxious or depressed appearing.     Assessment & Plan:    Assessment  DM HTN Hyperlipidemia Endometriosis Insomnia: Hardly ever uses Ambien prn Back pain: Dr.Wang prn  OSA can't tolerate CPAP Menopausal H/o Chest pain 2008, low risk stress test  PLAN DM: On metformin, no ambulatory CBGs, check A1c. HTN: On HCTZ, lisinopril, metoprolol, potassium.  No ambulatory BPs.  BP today is very good. High cholesterol: On Lipitor, checking labs Abdominal pain: As described above, etiology unclear, checking LFTs, CBCs.  Recommend to increase Protonix from 20 mg to 40 mg twice daily .  Reassess in 3 months. Weight loss: Eating less, appetite somewhat  decreased, checking labs.  Reassess in 3 months "Lumps" arms: Observation. RTC 3 months

## 2017-04-23 NOTE — Assessment & Plan Note (Addendum)
-  Td 07-2014 ; pneumonia shot 08 and 2015; prevnar  03-2016; had a flu shot   Zostavax: October 2015 ; shingrix discussed     -CCS: colonoscopy  07/2008.  Two polyps, hyperplastic.   Cscope 10-2014 @ [facility], no polyps, recheck 5 years d/t personal h/o polyps  - Female care:  has not seen  gyn in a while , rec to call and get an appointment Never had a DEXA. No h/o Fx or FH osteoporosis; [facility], has a schedule f/u already  -Diet-exercise discussed  -labs: CMP, FLP, CBC, A1c, TSH

## 2017-04-23 NOTE — Assessment & Plan Note (Signed)
DM: On metformin, no ambulatory CBGs, check A1c. HTN: On HCTZ, lisinopril, metoprolol, potassium.  No ambulatory BPs.  BP today is very good. High cholesterol: On Lipitor, checking labs Abdominal pain: As described above, etiology unclear, checking LFTs, CBCs.  Recommend to increase Protonix from 20 mg to 40 mg twice daily .  Reassess in 3 months. Weight loss: Eating less, appetite somewhat decreased, checking labs.  Reassess in 3 months "Lumps" arms: Observation. RTC 3 months

## 2017-04-25 ENCOUNTER — Encounter: Payer: Self-pay | Admitting: Internal Medicine

## 2017-05-07 ENCOUNTER — Other Ambulatory Visit: Payer: Self-pay | Admitting: Internal Medicine

## 2017-05-07 DIAGNOSIS — E2839 Other primary ovarian failure: Secondary | ICD-10-CM

## 2017-05-12 ENCOUNTER — Ambulatory Visit: Payer: Self-pay

## 2017-05-30 ENCOUNTER — Ambulatory Visit
Admission: RE | Admit: 2017-05-30 | Discharge: 2017-05-30 | Disposition: A | Discharge status: Home / Self Care | Payer: Medicare Other | Source: Ambulatory Visit | Attending: Internal Medicine | Admitting: Internal Medicine

## 2017-05-30 ENCOUNTER — Other Ambulatory Visit: Payer: Self-pay | Admitting: Internal Medicine

## 2017-05-30 ENCOUNTER — Other Ambulatory Visit: Payer: Self-pay

## 2017-05-30 DIAGNOSIS — E2839 Other primary ovarian failure: Secondary | ICD-10-CM

## 2017-05-30 DIAGNOSIS — N63 Unspecified lump in unspecified breast: Secondary | ICD-10-CM

## 2017-05-30 DIAGNOSIS — Z1231 Encounter for screening mammogram for malignant neoplasm of breast: Secondary | ICD-10-CM

## 2017-06-04 ENCOUNTER — Telehealth: Payer: Self-pay | Admitting: *Deleted

## 2017-06-04 NOTE — Telephone Encounter (Signed)
Received Physician Orders from [facility]; forwarded to provider/SLS 02/13 

## 2017-06-06 NOTE — Telephone Encounter (Signed)
Orders signed and faxed to [facility] at [phone]. Form sent for scanning.

## 2017-06-09 ENCOUNTER — Ambulatory Visit
Admission: RE | Admit: 2017-06-09 | Discharge: 2017-06-09 | Disposition: A | Discharge status: Home / Self Care | Payer: Medicare Other | Source: Ambulatory Visit | Attending: Internal Medicine | Admitting: Internal Medicine

## 2017-06-09 DIAGNOSIS — N63 Unspecified lump in unspecified breast: Secondary | ICD-10-CM

## 2017-06-14 ENCOUNTER — Other Ambulatory Visit: Payer: Self-pay | Admitting: Internal Medicine

## 2017-06-16 ENCOUNTER — Other Ambulatory Visit: Payer: Self-pay | Admitting: Internal Medicine

## 2017-06-16 MED ORDER — PANTOPRAZOLE SODIUM 40 MG PO TBEC: 40.0000 mg | DELAYED_RELEASE_TABLET | Freq: Two times a day (BID) | ORAL | 1 refills | Status: DC

## 2017-06-18 ENCOUNTER — Other Ambulatory Visit: Payer: Self-pay | Admitting: Internal Medicine

## 2017-07-02 LAB — HM PAP SMEAR

## 2017-07-07 ENCOUNTER — Other Ambulatory Visit: Payer: Self-pay | Admitting: Internal Medicine

## 2017-07-15 ENCOUNTER — Other Ambulatory Visit: Payer: Self-pay | Admitting: Internal Medicine

## 2017-07-22 ENCOUNTER — Ambulatory Visit (INDEPENDENT_AMBULATORY_CARE_PROVIDER_SITE_OTHER): Payer: Medicare Other | Admitting: Internal Medicine

## 2017-07-22 ENCOUNTER — Encounter: Payer: Self-pay | Admitting: Internal Medicine

## 2017-07-22 VITALS — BP 126/72 | HR 76 | Temp 98.0°F | Resp 14 | Ht 61.0 in | Wt 187.5 lb

## 2017-07-22 DIAGNOSIS — I10 Essential (primary) hypertension: Secondary | ICD-10-CM

## 2017-07-22 DIAGNOSIS — E119 Type 2 diabetes mellitus without complications: Secondary | ICD-10-CM | POA: Diagnosis not present

## 2017-07-22 DIAGNOSIS — E785 Hyperlipidemia, unspecified: Secondary | ICD-10-CM

## 2017-07-22 NOTE — Progress Notes (Signed)
Subjective:    Patient ID: Nancy Martin, female    DOB: 03/04/1951, 67 y.o.   MRN: [id number]  DOS:  07/22/2017 Type of visit - description : rov Interval history: Patient is feeling very well. Lifestyle continued to improve, she is exercising regularly and eating healthier, has continued to lose weight. Ambulatory BPs okay when she checks Sleeps well, occasionally needs an Ambien.  Wt Readings from Last 3 Encounters:  07/22/17 187 lb 8 oz (85 kg)  04/23/17 198 lb (89.8 kg)  09/06/16 205 lb 6.4 oz (93.2 kg)     Review of Systems Denies fatigue.  No anxiety or depression  Past Medical History:  Diagnosis Date  . Anxiety   . Chest pain 2008   saw Dr.TraciTurner, stress test--low risk  . Diabetes mellitus, type 2 (HCC)   . Endometriosis   . Hyperlipemia   . Hypertension   . Insomnia   . Menopausal state   . Rectal polyp    h/o  . Sleep apnea    can't tolerate CPAP    Past Surgical History:  Procedure Laterality Date  . colonoscopy with polypectomy    . LAPAROTOMY     endometriosis  . ROTATOR CUFF REPAIR Right 09/2016  . TONSILLECTOMY      Social History   Socioeconomic History  . Marital status: Single    Spouse name: Not on file  . Number of children: 1  . Years of education: Not on file  . Highest education level: Not on file  Occupational History  . 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 Financial resource strain: Not on file  . Food insecurity:    Worry: Not on file    Inability: Not on file  . Transportation needs:    Medical: Not on file    Non-medical: Not on file  Tobacco Use  . Smoking status: Never Smoker  . Smokeless tobacco: Never Used  Substance and Sexual Activity  . Alcohol use: Yes    Alcohol/week: 0.0 oz    Comment: rarely  . Drug use: No  . Sexual activity: Not on file  Lifestyle  . Physical activity:    Days per week: Not on file    Minutes per session: Not on file  .  Stress: Not on file  Relationships  . Social connections:    Talks on phone: Not on file    Gets together: Not on file    Attends religious service: Not on file    Active member of club or organization: Not on file    Attends meetings of clubs or organizations: Not on file    Relationship status: Not on file  . Intimate partner violence:    Fear of current or ex partner: Not on file    Emotionally abused: Not on file    Physically abused: Not on file    Forced sexual activity: Not on file  Other Topics Concern  . Not on file  Social History Narrative   Lives by herself      Allergies as of 07/22/2017      Reactions   Shellfish Allergy Hives      Medication List        Accurate as of 07/22/17  5:44 PM. Always use your most recent med list.          amLODipine 5 MG tablet Commonly known as:  NORVASC Take 1 tablet (5 mg total)  by mouth daily.   aspirin EC 81 MG tablet Take 81 mg by mouth daily.   atorvastatin 20 MG tablet Commonly known as:  LIPITOR Take 1 tablet (20 mg total) by mouth daily.   glucose blood test strip Check blood sugar no more than twice daily   hydrochlorothiazide 25 MG tablet Commonly known as:  HYDRODIURIL Take 1 tablet (25 mg total) by mouth daily.   lisinopril 10 MG tablet Commonly known as:  PRINIVIL,ZESTRIL Take 1 tablet (10 mg total) by mouth daily.   MELATONIN PO Take 1 tablet by mouth daily.   metFORMIN 1000 MG tablet Commonly known as:  GLUCOPHAGE Take 1 tablet (1,000 mg total) by mouth 2 (two) times daily with a meal.   metoprolol tartrate 25 MG tablet Commonly known as:  LOPRESSOR Take 1 tablet (25 mg total) by mouth 2 (two) times daily.   onetouch ultrasoft lancets Check blood sugars no more than twice daily.   pantoprazole 40 MG tablet Commonly known as:  PROTONIX Take 1 tablet (40 mg total) by mouth 2 (two) times daily before a meal.   potassium chloride 10 MEQ tablet Commonly known as:  K-DUR Take 1 tablet (10 mEq  total) by mouth daily.   Vitamin D3 5000 units Tabs Take 1 tablet by mouth daily.   zolpidem 10 MG tablet Commonly known as:  AMBIEN Take 1 tablet (10 mg total) by mouth at bedtime as needed for sleep.          Objective:   Physical Exam BP 126/72 (BP Location: Left Arm, Patient Position: Sitting, Cuff Size: Normal)   Pulse 76   Temp 98 F (36.7 C) (Oral)   Resp 14   Ht 5\' 1"  (1.549 m)   Wt 187 lb 8 oz (85 kg)   SpO2 98%   BMI 35.43 kg/m  General:   Well developed, well nourished . NAD.  HEENT:  Normocephalic . Face symmetric, atraumatic Lungs:  CTA B Normal respiratory effort, no intercostal retractions, no accessory muscle use. Heart: RRR,  no murmur.  No pretibial edema bilaterally  Skin: Not pale. Not jaundice Neurologic:  alert & oriented X3.  Speech normal, gait appropriate for age and unassisted Psych--  Cognition and judgment appear intact.  Cooperative with normal attention span and concentration.  Behavior appropriate. No anxious or depressed appearing.      Assessment & Plan:  Assessment  DM HTN Hyperlipidemia Endometriosis Insomnia: Hardly ever uses Ambien prn Back pain: Dr.Wang prn  OSA can't tolerate CPAP Menopausal H/o Chest pain 2008, low risk stress test  PLAN DM: Has changed her lifestyle completely since retirement,  doing well, continue metformin. HTN: Currently on amlodipine 5 mg, HCTZ, potassium, lisinopril and Lopressor.  Suspect she will need less medication given improve lifestyle, decrease amlodipine and stop if possible.  See AVS High cholesterol: Well-controlled on Lipitor RTC 4 months

## 2017-07-22 NOTE — Assessment & Plan Note (Signed)
DM: Has changed her lifestyle completely since retirement,  doing well, continue metformin. HTN: Currently on amlodipine 5 mg, HCTZ, potassium, lisinopril and Lopressor.  Suspect she will need less medication given improve lifestyle, decrease amlodipine and stop if possible.  See AVS High cholesterol: Well-controlled on Lipitor RTC 4 months

## 2017-07-22 NOTE — Patient Instructions (Addendum)
  GO TO THE FRONT DESK Schedule your next appointment for a checkup in 4 months  Amlodipine 5 mg: Decrease to half tablet daily After 1 month, okay to stop it if your blood pressure is a still very good.  Check the  blood pressure 2 or 3 times a week Be sure your blood pressure is between 110/65 and  135/85. If it is consistently higher or lower, let me know

## 2017-07-22 NOTE — Progress Notes (Signed)
Pre visit review using our clinic review tool, if applicable. No additional management support is needed unless otherwise documented below in the visit note. 

## 2017-08-25 ENCOUNTER — Other Ambulatory Visit: Payer: Self-pay | Admitting: Internal Medicine

## 2017-09-13 ENCOUNTER — Other Ambulatory Visit: Payer: Self-pay | Admitting: Internal Medicine

## 2017-10-01 ENCOUNTER — Other Ambulatory Visit: Payer: Self-pay | Admitting: Internal Medicine

## 2017-11-13 ENCOUNTER — Encounter: Payer: Self-pay | Admitting: Internal Medicine

## 2017-12-03 ENCOUNTER — Ambulatory Visit: Payer: Self-pay | Admitting: Internal Medicine

## 2017-12-19 ENCOUNTER — Encounter: Payer: Self-pay | Admitting: Internal Medicine

## 2017-12-19 ENCOUNTER — Ambulatory Visit (INDEPENDENT_AMBULATORY_CARE_PROVIDER_SITE_OTHER): Payer: Medicare Other | Admitting: Internal Medicine

## 2017-12-19 VITALS — BP 100/58 | HR 73 | Temp 97.5°F | Resp 16 | Ht 61.0 in | Wt 178.8 lb

## 2017-12-19 DIAGNOSIS — R42 Dizziness and giddiness: Secondary | ICD-10-CM

## 2017-12-19 DIAGNOSIS — I10 Essential (primary) hypertension: Secondary | ICD-10-CM

## 2017-12-19 DIAGNOSIS — G4700 Insomnia, unspecified: Secondary | ICD-10-CM | POA: Diagnosis not present

## 2017-12-19 DIAGNOSIS — E119 Type 2 diabetes mellitus without complications: Secondary | ICD-10-CM

## 2017-12-19 NOTE — Progress Notes (Addendum)
Subjective:    Patient ID: Nancy Martin, female    DOB: [birth year], 67 y.o.   MRN: [id number]  DOS:  12/19/2017 Type of visit - description : f/u Interval history: DM: Doing great with diet and exercise, has lost 9 pounds  Complaint of "vertigo": Reports feeling dizzy for a short period of time after she moves her head quickly or looking up and then down. No symptoms when she turns in bed.  HTN: Good med compliance, check her blood pressures rarely, it is usually normal but a couple of times have been low.  We d/c amlodipine a while back.  Wt Readings from Last 3 Encounters:  12/19/17 178 lb 12.8 oz (81.1 kg)  07/22/17 187 lb 8 oz (85 kg)  04/23/17 198 lb (89.8 kg)   BP Readings from Last 3 Encounters:  12/19/17 (!) 100/58  07/22/17 126/72  04/23/17 124/76      Review of Systems Denies chest pain, difficulty breathing or palpitations. No headaches, diplopia, slurred speech or motor deficits.   Past Medical History:  Diagnosis Date  . Anxiety   . Chest pain 2008   saw Dr.TraciTurner, stress test--low risk  . Diabetes mellitus, type 2 (HCC)   . Endometriosis   . Hyperlipemia   . Hypertension   . Insomnia   . Menopausal state   . Rectal polyp    h/o  . Sleep apnea    can't tolerate CPAP    Past Surgical History:  Procedure Laterality Date  . colonoscopy with polypectomy    . LAPAROTOMY     endometriosis  . ROTATOR CUFF REPAIR Right 09/2016  . TONSILLECTOMY      Social History   Socioeconomic History  . Marital status: Single    Spouse name: Not on file  . Number of children: 1  . Years of education: Not on file  . Highest education level: Not on file  Occupational History  . 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 Financial resource strain: Not on file  . Food insecurity:    Worry: Not on file    Inability: Not on file  . Transportation needs:    Medical: Not on file    Non-medical: Not on  file  Tobacco Use  . Smoking status: Never Smoker  . Smokeless tobacco: Never Used  Substance and Sexual Activity  . Alcohol use: Yes    Comment: rarely  . Drug use: No  . Sexual activity: Not on file  Lifestyle  . Physical activity:    Days per week: Not on file    Minutes per session: Not on file  . Stress: Not on file  Relationships  . Social connections:    Talks on phone: Not on file    Gets together: Not on file    Attends religious service: Not on file    Active member of club or organization: Not on file    Attends meetings of clubs or organizations: Not on file    Relationship status: Not on file  . Intimate partner violence:    Fear of current or ex partner: Not on file    Emotionally abused: Not on file    Physically abused: Not on file    Forced sexual activity: Not on file  Other Topics Concern  . Not on file  Social History Narrative   Lives by herself      Allergies as of 12/19/2017  Reactions   Shellfish Allergy Hives      Medication List        Accurate as of 12/19/17 11:59 PM. Always use your most recent med list.          aspirin EC 81 MG tablet Take 81 mg by mouth daily.   atorvastatin 20 MG tablet Commonly known as:  LIPITOR Take 1 tablet (20 mg total) by mouth daily.   glucose blood test strip Check blood sugar no more than twice daily   lisinopril 10 MG tablet Commonly known as:  PRINIVIL,ZESTRIL Take 1 tablet (10 mg total) by mouth daily.   MELATONIN PO Take 1 tablet by mouth daily.   metFORMIN 1000 MG tablet Commonly known as:  GLUCOPHAGE Take 1 tablet (1,000 mg total) by mouth 2 (two) times daily with a meal.   metoprolol tartrate 25 MG tablet Commonly known as:  LOPRESSOR Take 1 tablet (25 mg total) by mouth 2 (two) times daily.   onetouch ultrasoft lancets Check blood sugars no more than twice daily.   pantoprazole 40 MG tablet Commonly known as:  PROTONIX Take 1 tablet (40 mg total) by mouth 2 (two) times  daily before a meal.   Vitamin D3 5000 units Tabs Take 1 tablet by mouth daily.   zolpidem 10 MG tablet Commonly known as:  AMBIEN Take 1 tablet (10 mg total) by mouth at bedtime as needed for sleep.          Objective:   Physical Exam BP (!) 100/58 (BP Location: Left Arm, Patient Position: Sitting, Cuff Size: Small)   Pulse 73   Temp (!) 97.5 F (36.4 C) (Oral)   Resp 16   Ht 5\' 1"  (1.549 m)   Wt 178 lb 12.8 oz (81.1 kg)   SpO2 98%   BMI 33.78 kg/m  General:   Well developed, NAD, see BMI.  HEENT:  Normocephalic . Face symmetric, atraumatic Lungs:  CTA B Normal respiratory effort, no intercostal retractions, no accessory muscle use. Heart: RRR,  no murmur.  No pretibial edema bilaterally  Skin: Not pale. Not jaundice DIABETIC FEET EXAM: No lower extremity edema Normal pedal pulses bilaterally Skin normal, nails normal, no calluses Pinprick examination of the feet normal. Neurologic:  alert & oriented X3.  Speech normal, gait appropriate for age and unassisted Psych--  Cognition and judgment appear intact.  Cooperative with normal attention span and concentration.  Behavior appropriate. No anxious or depressed appearing.      Assessment & Plan:   Assessment  DM HTN Hyperlipidemia Endometriosis Insomnia: Hardly ever uses Ambien prn Back pain: Dr.Wang prn  OSA can't tolerate CPAP Menopausal H/o Chest pain 2008, low risk stress test  PLAN DM: foot exam wnl; On metformin, doing great with lifestyle, has decreased 9 pounds.  Check A1c.  See instructions HTN: Since the last visit she has stopped amlodipine as instructed.  She continue with HCTZ, lisinopril, Lopressor and potassium.  BP is low, BP recheck today 108/60, she also feels dizzy sometimes, unclear if there is a relationship. Plan: Stop HCTZ and potassium, monitor BPs, BMP in 2 weeks.  See instructions Vertigo: Seems inner ear related, could be also due to low BP.  See above.  Monitor sxs for now.   Check a CBC. Insomnia: Hardly ever takes Ambien. RTC 4-6 months

## 2017-12-19 NOTE — Patient Instructions (Signed)
  GO TO THE FRONT DESK  Schedule labs to be done 2 weeks from today.  No need to be fasting Schedule your next appointment for a routine follow-up in 4 to 6 months  Stop hydrochlorothiazide and the potassium supplements   Check the  blood pressure 2 or 3 times a   Week  Be sure your blood pressure is between 110/65 and  135/85. If it is consistently higher or lower, let me know

## 2017-12-21 NOTE — Assessment & Plan Note (Signed)
DM: foot exam wnl; On metformin, doing great with lifestyle, has decreased 9 pounds.  Check A1c.  See instructions HTN: Since the last visit she has stopped amlodipine as instructed.  She continue with HCTZ, lisinopril, Lopressor and potassium.  BP is low, BP recheck today 108/60, she also feels dizzy sometimes, unclear if there is a relationship. Plan: Stop HCTZ and potassium, monitor BPs, BMP in 2 weeks.  See instructions Vertigo: Seems inner ear related, could be also due to low BP.  See above.  Monitor sxs for now.  Check a CBC. Insomnia: Hardly ever takes Ambien. RTC 4-6 months

## 2018-01-01 ENCOUNTER — Other Ambulatory Visit: Payer: Self-pay

## 2018-01-02 ENCOUNTER — Telehealth: Payer: Self-pay

## 2018-01-02 NOTE — Telephone Encounter (Signed)
Copied from CRM [phone]#[id number]. Topic: Inquiry >> Jan 02, 2018 12:56 PM Alexander BergeronBarksdale, Harvey B wrote: Reason for CRM: pt's bp read today @ 139/103 9.12.19: 131/103 9.Marland Kitchen.9.19: 90/71  Pt was told to let pcp know the readings

## 2018-01-04 NOTE — Telephone Encounter (Signed)
We recently stopped HCTZ and potassium, still on metoprolol 25 B.I.D. and lisinopril 10 mg daily. BP increasing a little. Recommend patient to continue monitoring, call with readings in 2 weeks, if BP is elevated will increase metoprolol or lisinopril.

## 2018-01-05 NOTE — Telephone Encounter (Signed)
Called patient with Dr. Maurilio LovelyPazs' recommendations. Patient agreed to call in BP readings in 2 weeks..Marland Kitchen

## 2018-01-09 ENCOUNTER — Other Ambulatory Visit: Payer: Medicare Other

## 2018-01-09 NOTE — Addendum Note (Signed)
Addended by: Verdie ShireBAYNES, Moncerrath Berhe M on: 01/09/2018 11:43 AM   Modules accepted: Orders

## 2018-01-12 ENCOUNTER — Other Ambulatory Visit (INDEPENDENT_AMBULATORY_CARE_PROVIDER_SITE_OTHER): Payer: Medicare Other

## 2018-01-12 DIAGNOSIS — I10 Essential (primary) hypertension: Secondary | ICD-10-CM | POA: Diagnosis not present

## 2018-01-12 DIAGNOSIS — E119 Type 2 diabetes mellitus without complications: Secondary | ICD-10-CM | POA: Diagnosis not present

## 2018-01-12 DIAGNOSIS — R42 Dizziness and giddiness: Secondary | ICD-10-CM

## 2018-01-12 LAB — BASIC METABOLIC PANEL
BUN: 12 mg/dL (ref 6–23)
CHLORIDE: 104 meq/L (ref 96–112)
CO2: 28 mEq/L (ref 19–32)
CREATININE: 1.16 mg/dL (ref 0.40–1.20)
Calcium: 9.4 mg/dL (ref 8.4–10.5)
GFR: 60.08 mL/min (ref >60.00)
Glucose, Bld: 93 mg/dL (ref 70–99)
Potassium: 4.7 mEq/L (ref 3.5–5.1)
Sodium: 140 mEq/L (ref 135–145)

## 2018-01-12 LAB — CBC
HEMATOCRIT: 36.1 % (ref 36.0–46.0)
Hemoglobin: 11.8 g/dL — ABNORMAL LOW (ref 12.0–15.0)
MCHC: 32.8 g/dL (ref 30.0–36.0)
MCV: 88.8 fl (ref 78.0–100.0)
Platelets: 285 10*3/uL (ref 150.0–400.0)
RBC: 4.06 Mil/uL (ref 3.87–5.11)
RDW: 14.6 % (ref 11.5–15.5)
WBC: 5.8 10*3/uL (ref 4.0–10.5)

## 2018-01-12 LAB — HEMOGLOBIN A1C: HEMOGLOBIN A1C: 6.3 % (ref 4.6–6.5)

## 2018-01-19 NOTE — Progress Notes (Addendum)
Subjective:   Nancy Martin is a 67 y.o. female who presents for an Initial Medicare Annual Wellness Visit.  Pt works part time at [employer].  Review of Systems   No ROS.  Medicare Wellness Visit. Additional risk factors are reflected in the social history. Cardiac Risk Factors include: advanced age (>58men, >51 women);dyslipidemia;diabetes mellitus;hypertension;obesity (BMI >30kg/m2)   Sleep patterns: Takes Melatonin as needed.  Home Safety/Smoke Alarms: Feels safe in home. Smoke alarms in place. Lives in split level home. In the process of looking for 1 level home. Lives alone.  Female:    Mammo- utd    Dexa scan- utd       CCS- last abstract entry 11/18/14- recall 5 yrs Eye-Dr.Sellars yearly.    Objective:    Today's Vitals   01/20/18 1012  BP: (!) 148/90  Pulse: 68  SpO2: 98%  Weight: 184 lb 6.4 oz (83.6 kg)  Height: 5\' 1"  (1.549 m)   Body mass index is 34.84 kg/m.  Advanced Directives 01/20/2018  Does Patient Have a Medical Advance Directive? Yes  Type of Estate agent of [place];Living will  Does patient want to make changes to medical advance directive? No - Patient declined  Copy of Healthcare Power of Attorney in Chart? No - copy requested    Current Medications (verified) Outpatient Encounter Medications as of 01/20/2018  Medication Sig  . aspirin EC 81 MG tablet Take 81 mg by mouth daily.  Marland Kitchen atorvastatin (LIPITOR) 20 MG tablet Take 1 tablet (20 mg total) by mouth daily.  . Cholecalciferol (VITAMIN D3) 5000 units TABS Take 1 tablet by mouth daily.  Marland Kitchen lisinopril (PRINIVIL,ZESTRIL) 10 MG tablet Take 1 tablet (10 mg total) by mouth daily.  Marland Kitchen MELATONIN PO Take 1 tablet by mouth daily.  . metFORMIN (GLUCOPHAGE) 1000 MG tablet Take 1 tablet (1,000 mg total) by mouth 2 (two) times daily with a meal.  . metoprolol tartrate (LOPRESSOR) 25 MG tablet Take 1 tablet (25 mg total) by mouth 2 (two) times daily.  . pantoprazole (PROTONIX) 40 MG tablet  Take 1 tablet (40 mg total) by mouth 2 (two) times daily before a meal.  . glucose blood test strip Check blood sugar no more than twice daily (Patient not taking: Reported on 01/20/2018)  . Lancets (ONETOUCH ULTRASOFT) lancets Check blood sugars no more than twice daily. (Patient not taking: Reported on 01/20/2018)  . zolpidem (AMBIEN) 10 MG tablet Take 1 tablet (10 mg total) by mouth at bedtime as needed for sleep. (Patient not taking: Reported on 01/20/2018)   No facility-administered encounter medications on file as of 01/20/2018.     Allergies (verified) Shellfish allergy   History: Past Medical History:  Diagnosis Date  . Anxiety   . Chest pain 2008   saw Dr.TraciTurner, stress test--low risk  . Diabetes mellitus, type 2 (HCC)   . Endometriosis   . Hyperlipemia   . Hypertension   . Insomnia   . Menopausal state   . Rectal polyp    h/o  . Sleep apnea    can't tolerate CPAP   Past Surgical History:  Procedure Laterality Date  . colonoscopy with polypectomy    . LAPAROTOMY     endometriosis  . ROTATOR CUFF REPAIR Right 09/2016  . TONSILLECTOMY     Family History  Problem Relation Age of Onset  . Diabetes Mother   . Hyperlipidemia Mother   . Thyroid disease Mother   . Diabetes Father   . Hyperlipidemia  Father   . Pancreatic cancer Father   . Stroke Brother 65       aneurysm  . Diabetes Brother   . Breast cancer Other   . Heart disease Neg Hx   . Colon cancer Neg Hx    Social History   Socioeconomic History  . Marital status: Single    Spouse name: Not on file  . Number of children: 1  . Years of education: Not on file  . Highest education level: Not on file  Occupational History  . 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 Financial resource strain: Not on file  . Food insecurity:    Worry: Not on file    Inability: Not on file  . Transportation needs:    Medical: Not on file    Non-medical: Not on file    Tobacco Use  . Smoking status: Never Smoker  . Smokeless tobacco: Never Used  Substance and Sexual Activity  . Alcohol use: Yes    Comment: rarely  . Drug use: No  . Sexual activity: Not Currently  Lifestyle  . Physical activity:    Days per week: Not on file    Minutes per session: Not on file  . Stress: Not on file  Relationships  . Social connections:    Talks on phone: Not on file    Gets together: Not on file    Attends religious service: Not on file    Active member of club or organization: Not on file    Attends meetings of clubs or organizations: Not on file    Relationship status: Not on file  Other Topics Concern  . Not on file  Social History Narrative   Lives by herself    Tobacco Counseling Counseling given: Not Answered   Clinical Intake: Pain : No/denies pain    Activities of Daily Living In your present state of health, do you have any difficulty performing the following activities: 01/20/2018 04/23/2017  Hearing? N N  Vision? N N  Difficulty concentrating or making decisions? N N  Walking or climbing stairs? N N  Dressing or bathing? N N  Doing errands, shopping? N N  Preparing Food and eating ? N -  Using the Toilet? N -  In the past six months, have you accidently leaked urine? N -  Do you have problems with loss of bowel control? N -  Managing your Medications? N -  Managing your Finances? N -  Housekeeping or managing your Housekeeping? N -  Some recent data might be hidden     Immunizations and Health Maintenance Immunization History  Administered Date(s) Administered  . Influenza Whole 03/16/2007, 02/07/2009, 01/09/2010  . Influenza,inj,Quad PF,6+ Mos 01/20/2013, 01/27/2015  . Influenza-Unspecified 12/22/2015, 02/07/2017  . Pneumococcal Conjugate-13 04/17/2016  . Pneumococcal Polysaccharide-23 03/16/2007, 05/27/2013  . Tdap 04/22/2004, 08/22/2014  . Zoster 01/20/2014   Health Maintenance Due  Topic Date Due  . FOOT EXAM   08/22/2015  . INFLUENZA VACCINE  11/20/2017    Patient Care Team: Wanda Plump, MD as PCP - General  Indicate any recent Medical Services you may have received from other than [facility] providers in the past year (date may be approximate).     Assessment:   This is a routine wellness examination for Nancy Martin. Physical assessment deferred to PCP.  Hearing/Vision screen  Visual Acuity Screening   Right eye Left eye Both eyes  Without correction:  With correction: 20/20 20/32 20/32   Hearing Screening Comments: Able to hear conversational tones w/o difficulty. No issues reported.    Dietary issues and exercise activities discussed: Current Exercise Habits: Structured exercise class, Type of exercise: strength training/weights(bike), Time (Minutes): 60, Frequency (Times/Week): 3, Weekly Exercise (Minutes/Week): 180, Exercise limited by: None identified Diet (meal preparation, eat out, water intake, caffeinated beverages, dairy products, fruits and vegetables): well balanced   Goals   None    Depression Screen PHQ 2/9 Scores 01/20/2018 04/23/2017 04/17/2016 01/27/2015  PHQ - 2 Score 0 0 0 0    Fall Risk Fall Risk  01/20/2018 04/23/2017 04/17/2016 01/27/2015  Falls in the past year? No No No No    Cognitive Function: Ad8 score reviewed for issues:  Issues making decisions:no  Less interest in hobbies / activities:no  Repeats questions, stories (family complaining):no  Trouble using ordinary gadgets (microwave, computer, phone):no  Forgets the month or year: no  Mismanaging finances: no  Remembering appts:non  Daily problems with thinking and/or memory:no Ad8 score is=0         Screening Tests Health Maintenance  Topic Date Due  . FOOT EXAM  08/22/2015  . INFLUENZA VACCINE  11/20/2017  . OPHTHALMOLOGY EXAM  04/23/2018  . PNA vac Low Risk Adult (2 of 2 - PPSV23) 05/27/2018  . MAMMOGRAM  06/09/2018  . HEMOGLOBIN A1C  07/13/2018  . COLONOSCOPY  11/18/2019  .  TETANUS/TDAP  08/21/2024  . DEXA SCAN  Completed  . Hepatitis C Screening  Completed      Plan:    Please schedule your next medicare wellness visit with me in 1 yr.  Continue to eat heart healthy diet (full of fruits, vegetables, whole grains, lean protein, water--limit salt, fat, and sugar intake) and increase physical activity as tolerated.  Continue doing brain stimulating activities (puzzles, reading, adult coloring books, staying active) to keep memory sharp.   Bring a copy of your living will and/or healthcare power of attorney to your next office visit.  Your blood pressure was high today. Dr.Paz aware you have forgot to take your last two blood pressure pills. Dr.Paz wants you to check your blood pressure everyday at home and call us if it is abnormal as discussed.  I have personally reviewed and noted the following in the patient's chart:   . Medical and social history . Use of alcohol, tobacco or illicit drugs  . Current medications and supplements . Functional ability and status . Nutritional status . Physical activity . Advanced directives . List of other physicians . Hospitalizations, surgeries, and ER visits in previous 12 months . Vitals . Screenings to include cognitive, depression, and falls . Referrals and appointments  In addition, I have reviewed and discussed with patient certain preventive protocols, quality metrics, and best practice recommendations. A written personalized care plan for preventive services as well as general preventive health recommendations were provided to patient.     Mady Haagensen [place], [place]   01/20/2018   Willow Ora, MD

## 2018-01-20 ENCOUNTER — Ambulatory Visit (INDEPENDENT_AMBULATORY_CARE_PROVIDER_SITE_OTHER): Payer: Medicare Other | Admitting: *Deleted

## 2018-01-20 ENCOUNTER — Encounter: Payer: Self-pay | Admitting: *Deleted

## 2018-01-20 VITALS — BP 148/90 | HR 68 | Ht 61.0 in | Wt 184.4 lb

## 2018-01-20 DIAGNOSIS — Z0000 Encounter for general adult medical examination without abnormal findings: Secondary | ICD-10-CM | POA: Diagnosis not present

## 2018-01-20 NOTE — Patient Instructions (Signed)
Please schedule your next medicare wellness visit with me in 1 yr.  Continue to eat heart healthy diet (full of fruits, vegetables, whole grains, lean protein, water--limit salt, fat, and sugar intake) and increase physical activity as tolerated.  Continue doing brain stimulating activities (puzzles, reading, adult coloring books, staying active) to keep memory sharp.   Bring a copy of your living will and/or healthcare power of attorney to your next office visit.  Your blood pressure was high today. Dr.Paz aware you have forgot to take your last two blood pressure pills. Dr.Paz wants you to check your blood pressure everyday at home and call us if it is abnormal as discussed.   Nancy Martin , Thank you for taking time to come for your Medicare Wellness Visit. I appreciate your ongoing commitment to your health goals. Please review the following plan we discussed and let me know if I can assist you in the future.   These are the goals we discussed: Goals   None     This is a list of the screening recommended for you and due dates:  Health Maintenance  Topic Date Due  . Complete foot exam   08/22/2015  . Flu Shot  11/20/2017  . Eye exam for diabetics  04/23/2018  . Pneumonia vaccines (2 of 2 - PPSV23) 05/27/2018  . Mammogram  06/09/2018  . Hemoglobin A1C  07/13/2018  . Colon Cancer Screening  11/18/2019  . Tetanus Vaccine  08/21/2024  . DEXA scan (bone density measurement)  Completed  .  Hepatitis C: One time screening is recommended by Center for Disease Control  (CDC) for  adults born from [birth year] through 1964.   Completed    How to Take Your Blood Pressure You can take your blood pressure at home with a machine. You may need to check your blood pressure at home:  To check if you have high blood pressure (hypertension).  To check your blood pressure over time.  To make sure your blood pressure medicine is working.  Supplies needed: You will need a blood pressure machine, or  monitor. You can buy one at a drugstore or online. When choosing one:  Choose one with an arm cuff.  Choose one that wraps around your upper arm. Only one finger should fit between your arm and the cuff.  Do not choose one that measures your blood pressure from your wrist or finger.  Your doctor can suggest a monitor. How to prepare Avoid these things for 30 minutes before checking your blood pressure:  Drinking caffeine.  Drinking alcohol.  Eating.  Smoking.  Exercising.  Five minutes before checking your blood pressure:  Pee.  Sit in a dining chair. Avoid sitting in a soft couch or armchair.  Be quiet. Do not talk.  How to take your blood pressure Follow the instructions that came with your machine. If you have a digital blood pressure monitor, these may be the instructions: 1. Sit up straight. 2. Place your feet on the floor. Do not cross your ankles or legs. 3. Rest your left arm at the level of your heart. You may rest it on a table, desk, or chair. 4. Pull up your shirt sleeve. 5. Wrap the blood pressure cuff around the upper part of your left arm. The cuff should be 1 inch (2.5 cm) above your elbow. It is best to wrap the cuff around bare skin. 6. Fit the cuff snugly around your arm. You should be able to place only  one finger between the cuff and your arm. 7. Put the cord inside the groove of your elbow. 8. Press the power button. 9. Sit quietly while the cuff fills with air and loses air. 10. Write down the numbers on the screen. 11. Wait 2-3 minutes and then repeat steps 1-10.  What do the numbers mean? Two numbers make up your blood pressure. The first number is called systolic pressure. The second is called diastolic pressure. An example of a blood pressure reading is "120 over 80" (or 120/80). If you are an adult and do not have a medical condition, use this guide to find out if your blood pressure is normal: Normal  First number: below 120.  Second  number: below 80. Elevated  First number: 120-129.  Second number: below 80. Hypertension stage 1  First number: 130-139.  Second number: 80-89. Hypertension stage 2  First number: 140 or above.  Second number: 90 or above. Your blood pressure is above normal even if only the top or bottom number is above normal. Follow these instructions at home:  Check your blood pressure as often as your doctor tells you to.  Take your monitor to your next doctor's appointment. Your doctor will: ? Make sure you are using it correctly. ? Make sure it is working right.  Make sure you understand what your blood pressure numbers should be.  Tell your doctor if your medicines are causing side effects. Contact a doctor if:  Your blood pressure keeps being high. Get help right away if:  Your first blood pressure number is higher than 180.  Your second blood pressure number is higher than 120. This information is not intended to replace advice given to you by your health care provider. Make sure you discuss any questions you have with your health care provider. Document Released: 03/21/2008 Document Revised: 03/06/2016 Document Reviewed: 09/15/2015 Elsevier Interactive Patient Education  2018 [employer] Blood Pressure Record Sheet Your blood pressure on this visit to the emergency department or clinic is elevated. This does not necessarily mean you have high blood pressure (hypertension), but it does mean that your blood pressure needs to be rechecked. Many times your blood pressure can increase due to illness, pain, anxiety, or other factors. We recommend that you get a series of blood pressure readings done over a period of 5 days. It is best to get a reading in the morning and one in the evening. You should make sure to sit and relax for 1-5 minutes before the reading is taken. Write the readings down and make a follow-up appointment with your health care provider to discuss the results. If  there is not a free clinic or a drug store with a blood-pressure-taking machine near you, you can purchase blood-pressure-taking equipment from a drug store. Having one in the home allows you the convenience of taking your blood pressure while you are home and relaxed. Blood Pressure Log Date: _______________________  a.m. _____________________  p.m. _____________________  Date: _______________________  a.m. _____________________  p.m. _____________________  Date: _______________________  a.m. _____________________  p.m. _____________________  Date: _______________________  a.m. _____________________  p.m. _____________________  Date: _______________________  a.m. _____________________  p.m. _____________________  This information is not intended to replace advice given to you by your health care provider. Make sure you discuss any questions you have with your health care provider. Document Released: 01/05/2003 Document Revised: 03/22/2016 Document Reviewed: 06/01/2013 Elsevier Interactive Patient Education  2018 [employer].

## 2018-01-21 NOTE — Progress Notes (Signed)
Ms. Tartt has received her flu shot by the undersigned to her RT deltoid.  Lot #[id number] Mfg: [id number] NDC: 16109-604-54 Exp: 10/20/18

## 2018-01-24 IMAGING — MG 2D DIGITAL SCREENING BILATERAL MAMMOGRAM WITH CAD AND ADJUNCT TO
8 of 14 series · 8 of 30 positions shown · non-contrast
Comparison: Previous exam(s).

ACR Breast Density Category a: The breast tissue is almost entirely
fatty.

CLINICAL DATA: Screening.

EXAM:
2D DIGITAL SCREENING BILATERAL MAMMOGRAM WITH CAD AND ADJUNCT TOMO

[R MLO (1 of 2)]
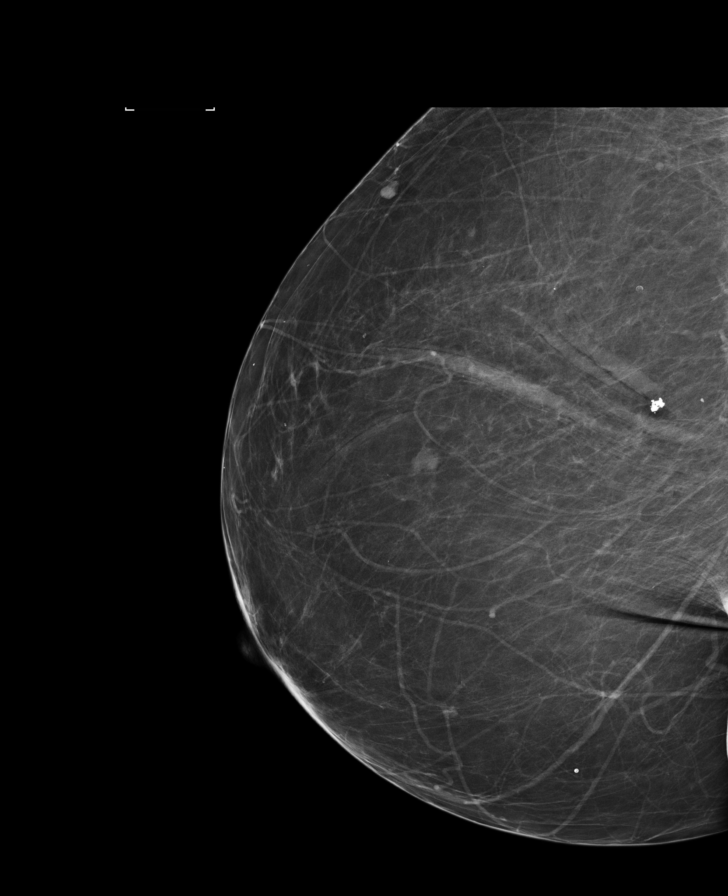

[L MLO (1 of 2)]
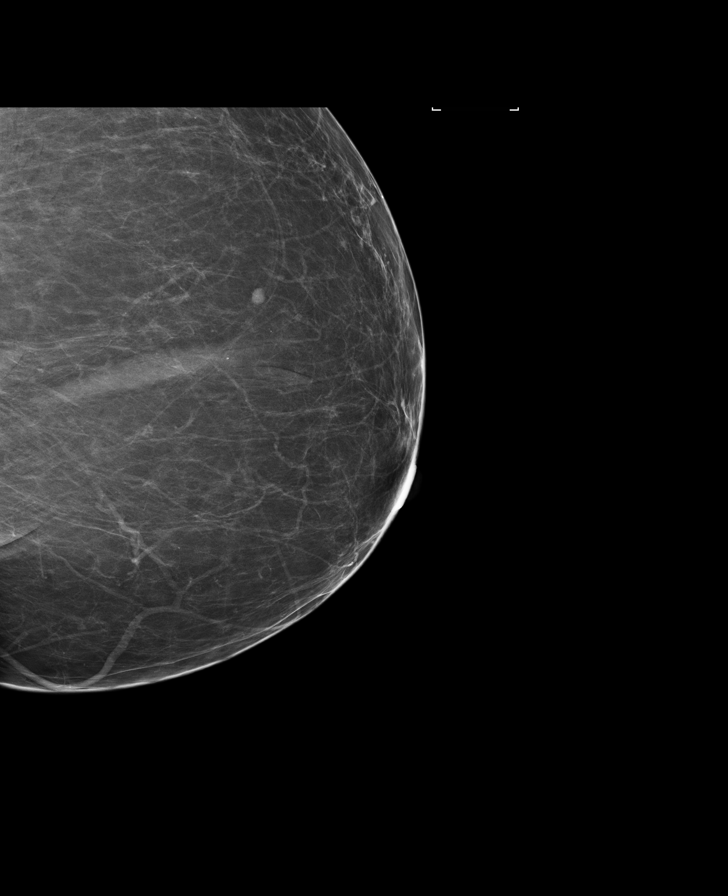

[R MLO (2 of 2)]
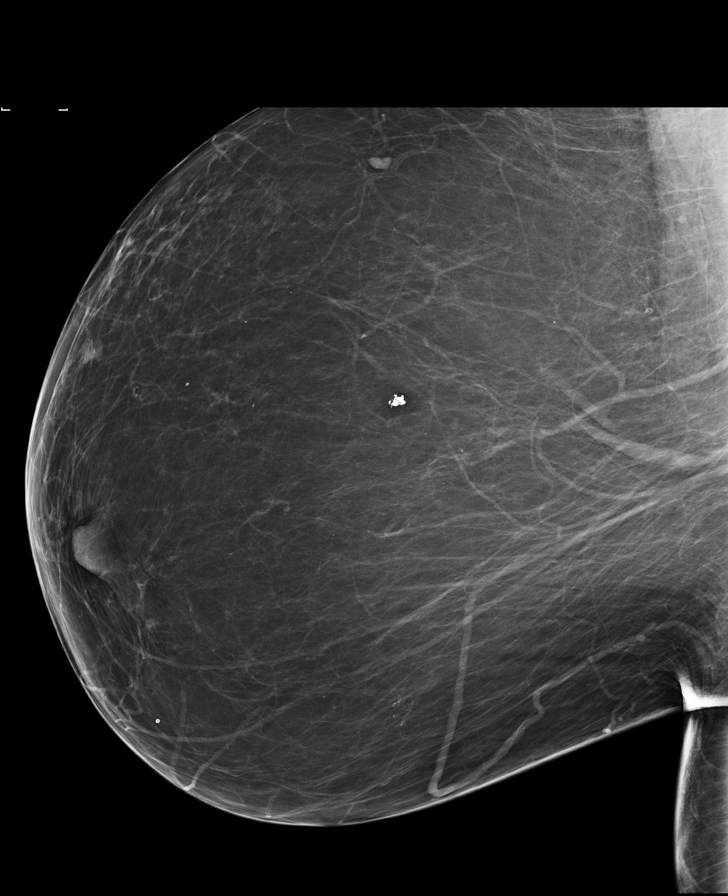

[R MLO synth-2D]
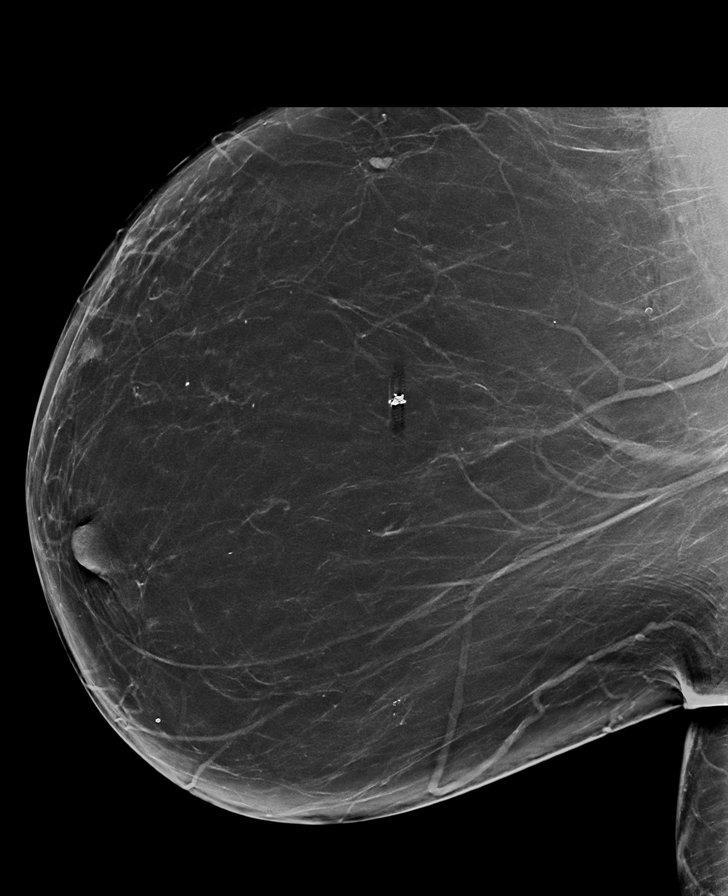

[L MLO (2 of 2)]
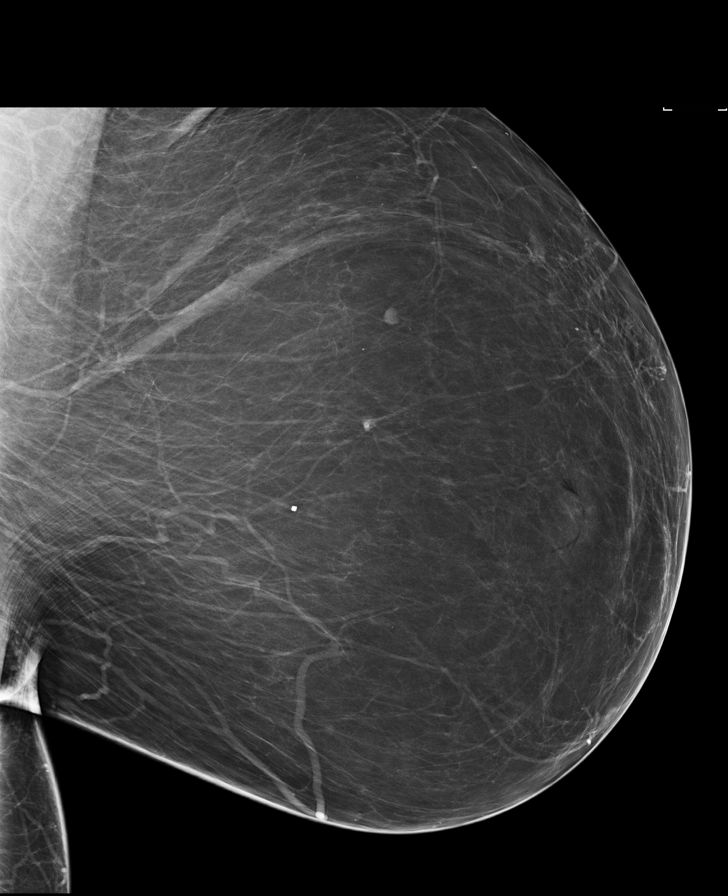

[L CC synth-2D]
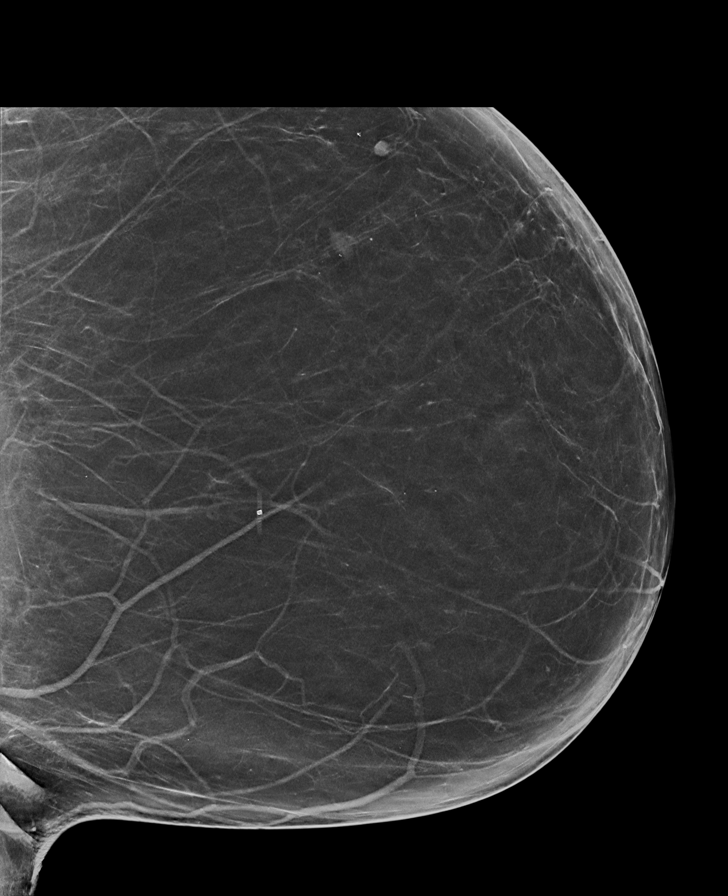

[R CC]
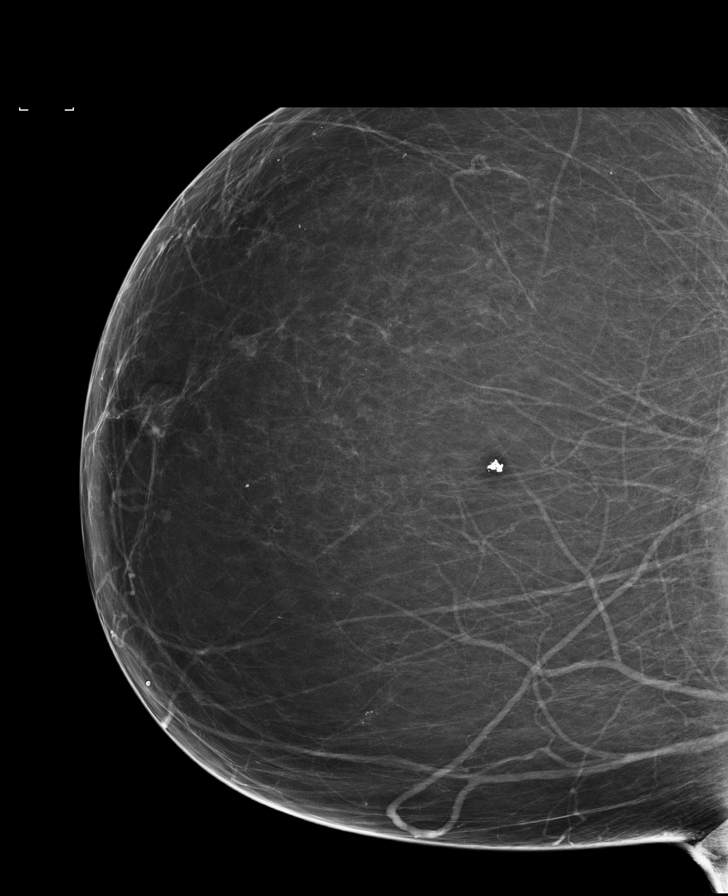

[R CC synth-2D]
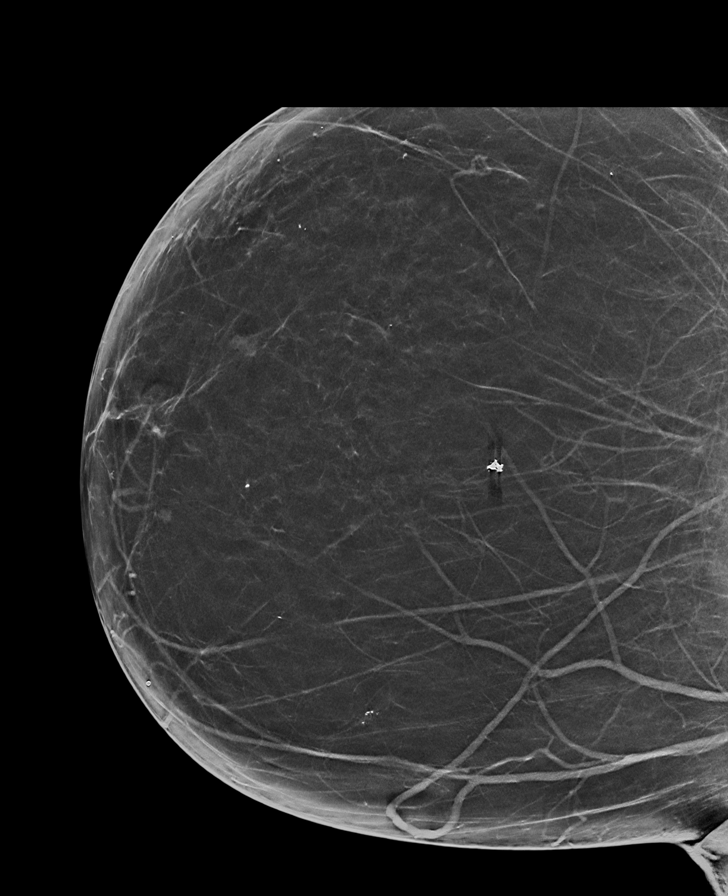

[8 of 30 positions shown; findings below may reference images not displayed]

FINDINGS: There are no findings suspicious for malignancy. Images were
processed with CAD.
IMPRESSION: No mammographic evidence of malignancy. A result letter of this
screening mammogram will be mailed directly to the patient.

RECOMMENDATION:
Screening mammogram in one year. (Code:1B-X-8P0)

BI-RADS CATEGORY  1: Negative.

## 2018-02-05 ENCOUNTER — Telehealth: Payer: Self-pay

## 2018-02-05 NOTE — Telephone Encounter (Signed)
Advise pt: BP reasonably controlled, no change

## 2018-02-05 NOTE — Telephone Encounter (Signed)
Copied from CRM [id number]. Topic: General - Other >> Feb 05, 2018  3:51 PM Percival Spanish wrote: Call with bp reading   01/20/18       am 167/110  pm 149/102 01/21/18/      am  138/91  01/22/18        am 105/82 01/23/18        am 126/90   pm 130/94 01/25/18        pm    120/80 01/26/18        pm  136/91 01/27/18       pm 119/88 01/28/18       pm 138/92 01/29/18     pm 119/84 01/31/18     pm 113/87 02/01/18       pm 129/86 10/14/ 19      pm 130/95 02/03/18       pm 124/87

## 2018-02-06 NOTE — Telephone Encounter (Signed)
LMOM informing Pt of recommendations. Instructed to call if questions/concerns.  

## 2018-03-04 ENCOUNTER — Other Ambulatory Visit: Payer: Self-pay | Admitting: Internal Medicine

## 2018-03-13 ENCOUNTER — Ambulatory Visit (INDEPENDENT_AMBULATORY_CARE_PROVIDER_SITE_OTHER): Payer: Medicare Other | Admitting: Internal Medicine

## 2018-03-13 ENCOUNTER — Ambulatory Visit: Payer: Self-pay

## 2018-03-13 ENCOUNTER — Encounter: Payer: Self-pay | Admitting: Internal Medicine

## 2018-03-13 ENCOUNTER — Ambulatory Visit (HOSPITAL_BASED_OUTPATIENT_CLINIC_OR_DEPARTMENT_OTHER)
Admission: RE | Admit: 2018-03-13 | Discharge: 2018-03-13 | Disposition: A | Discharge status: Home / Self Care | Payer: Medicare Other | Source: Ambulatory Visit | Attending: Internal Medicine | Admitting: Internal Medicine

## 2018-03-13 VITALS — BP 144/98 | HR 66 | Temp 98.3°F | Resp 16 | Ht 61.0 in | Wt 182.5 lb

## 2018-03-13 DIAGNOSIS — G4485 Primary stabbing headache: Secondary | ICD-10-CM

## 2018-03-13 DIAGNOSIS — I10 Essential (primary) hypertension: Secondary | ICD-10-CM | POA: Diagnosis not present

## 2018-03-13 MED ORDER — LISINOPRIL 20 MG PO TABS: 20.0000 mg | ORAL_TABLET | Freq: Every day | ORAL | 1 refills | Status: DC

## 2018-03-13 NOTE — Telephone Encounter (Signed)
Patient called in with c/o "high BP/headache." She says "I was on a cruise on 03/01/18 and I had a headache, went to the nurse and she said it was a migraine. I figured it may have been sinuses or just the cruise itself. I came home and on 03/02/18 my BP was 151/110. I checked it again on 03/07/18 and it was 141/104. Today I woke up with a headache and my BP was 151/110 about 30 minutes before I called you." I asked is she on BP medications, she says "yes and a couple of months ago, I was taken off a few because my BP was low and I was having dizziness. I take what I'm on faithfully." I asked about other symptoms besides headache, she denies. According to protocol, see PCP within 24 hours, appointment scheduled for today at 1520 with Dr. Drue NovelPaz, care advice given, patient verbalized understanding.  Reason for Disposition . Systolic BP  >= 180 OR Diastolic >= 110  Answer Assessment - Initial Assessment Questions 1. BLOOD PRESSURE: "What is the blood pressure?" "Did you take at least two measurements 5 minutes apart?"     151/110 2. ONSET: "When did you take your blood pressure?"     Today about 30 minutes ago 3. HOW: "How did you obtain the blood pressure?" (e.g., visiting nurse, automatic home BP monitor)     Automatic cuff at home 4. HISTORY: "Do you have a history of high blood pressure?"     Yes 5. MEDICATIONS: "Are you taking any medications for blood pressure?" "Have you missed any doses recently?"     Yes; took off a few within the last couple of months due to low BP and dizziness. Have not missed any doses 6. OTHER SYMPTOMS: "Do you have any symptoms?" (e.g., headache, chest pain, blurred vision, difficulty breathing, weakness)     Headache 7. PREGNANCY: "Is there any chance you are pregnant?" "When was your last menstrual period?"     No  Protocols used: HIGH BLOOD PRESSURE-A-AH

## 2018-03-13 NOTE — Progress Notes (Signed)
Pre visit review using our clinic review tool, if applicable. No additional management support is needed unless otherwise documented below in the visit note. 

## 2018-03-13 NOTE — Telephone Encounter (Signed)
FYI

## 2018-03-13 NOTE — Telephone Encounter (Signed)
thx

## 2018-03-13 NOTE — Patient Instructions (Signed)
   GO TO THE FRONT DESK Schedule your next appointment blood work only 3 weeks from today  Get your CAT scan at the first floor   Increase lisinopril to 20 mg tablets: 1 tablet daily If your blood pressure is not better in the next 2 to 3 days, take 2 tablets daily   Check the  blood pressure  daily Be sure your blood pressure is between 110/65 and  135/85. If it is consistently higher or lower, let me know   If you have another headache as severe as the first one let me know or go to the ER

## 2018-03-13 NOTE — Progress Notes (Signed)
Subjective:    Patient ID: Nancy Martin, female    DOB: 12/14/1950, 67 y.o.   MRN: [id number]  DOS:  03/13/2018 Type of visit - description : acute  Since the last office visit, BP has slowly increase at times to the  130/90 range On 03/01/2018, she was on a cruise, she wake up with a intense headache, at the front & crown.  When asked, admits that it was the worst of her life. She does not have a history of headaches.  She had some nausea and even vomiting 1 time. Symptoms linger in the day by the next day she was better. She arrived home 03/02/2018, check her blood pressure and it was ~ 150/110. 03/07/2018, systolic BP was 141. Today she woke up with a mild headache, BP was 151/110.  Wt Readings from Last 3 Encounters:  03/13/18 182 lb 8 oz (82.8 kg)  01/20/18 184 lb 6.4 oz (83.6 kg)  12/19/17 178 lb 12.8 oz (81.1 kg)     Review of Systems  She denies major problems with neck stiffness, slightly sore neck? No further nausea or vomiting Denies diplopia, slurred speech or facial numbness.  Past Medical History:  Diagnosis Date  . Anxiety   . Chest pain 2008   saw Dr.TraciTurner, stress test--low risk  . Diabetes mellitus, type 2 (HCC)   . Endometriosis   . Hyperlipemia   . Hypertension   . Insomnia   . Menopausal state   . Rectal polyp    h/o  . Sleep apnea    can't tolerate CPAP    Past Surgical History:  Procedure Laterality Date  . colonoscopy with polypectomy    . LAPAROTOMY     endometriosis  . ROTATOR CUFF REPAIR Right 09/2016  . TONSILLECTOMY      Social History   Socioeconomic History  . Marital status: Single    Spouse name: Not on file  . Number of children: 1  . Years of education: Not on file  . Highest education level: Not on file  Occupational History  . 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 Financial resource strain: Not on file  . Food insecurity:    Worry: Not on file   Inability: Not on file  . Transportation needs:    Medical: Not on file    Non-medical: Not on file  Tobacco Use  . Smoking status: Never Smoker  . Smokeless tobacco: Never Used  Substance and Sexual Activity  . Alcohol use: Yes    Comment: rarely  . Drug use: No  . Sexual activity: Not Currently  Lifestyle  . Physical activity:    Days per week: Not on file    Minutes per session: Not on file  . Stress: Not on file  Relationships  . Social connections:    Talks on phone: Not on file    Gets together: Not on file    Attends religious service: Not on file    Active member of club or organization: Not on file    Attends meetings of clubs or organizations: Not on file    Relationship status: Not on file  . Intimate partner violence:    Fear of current or ex partner: Not on file    Emotionally abused: Not on file    Physically abused: Not on file    Forced sexual activity: Not on file  Other Topics Concern  . Not on file  Social History Narrative   Lives by herself      Allergies as of 03/13/2018      Reactions   Shellfish Allergy Hives      Medication List        Accurate as of 03/13/18 11:59 PM. Always use your most recent med list.          aspirin EC 81 MG tablet Take 81 mg by mouth daily.   atorvastatin 20 MG tablet Commonly known as:  LIPITOR Take 1 tablet (20 mg total) by mouth daily.   glucose blood test strip Check blood sugar no more than twice daily   lisinopril 20 MG tablet Commonly known as:  PRINIVIL,ZESTRIL Take 1-2 tablets (20-40 mg total) by mouth daily.   MELATONIN PO Take 1 tablet by mouth daily.   metFORMIN 1000 MG tablet Commonly known as:  GLUCOPHAGE Take 1 tablet (1,000 mg total) by mouth 2 (two) times daily with a meal.   metoprolol tartrate 25 MG tablet Commonly known as:  LOPRESSOR Take 1 tablet (25 mg total) by mouth 2 (two) times daily.   onetouch ultrasoft lancets Check blood sugars no more than twice daily.     pantoprazole 40 MG tablet Commonly known as:  PROTONIX Take 1 tablet (40 mg total) by mouth 2 (two) times daily before a meal.   Vitamin D3 125 MCG (5000 UT) Tabs Take 1 tablet by mouth daily.   zolpidem 10 MG tablet Commonly known as:  AMBIEN Take 1 tablet (10 mg total) by mouth at bedtime as needed for sleep.           Objective:   Physical Exam BP (!) 144/98 (BP Location: Left Arm, Patient Position: Sitting, Cuff Size: Small)   Pulse 66   Temp 98.3 F (36.8 C) (Oral)   Resp 16   Ht 5\' 1"  (1.549 m)   Wt 182 lb 8 oz (82.8 kg)   SpO2 92%   BMI 34.48 kg/m  General:   Well developed, NAD, BMI noted. HEENT:   Normocephalic . Face symmetric, atraumatic Neck: No TTP of the cervical spine, range of motion normal Lungs:  CTA B Normal respiratory effort, no intercostal retractions, no accessory muscle use. Heart: RRR,  no murmur.  No pretibial edema bilaterally  Skin: Not pale. Not jaundice Neurologic:  alert & oriented X3.  Speech normal, gait appropriate for age and unassisted.  EOMI, pupils equal, reactive.  Motor symmetric.  Face symmetric. Psych--  Cognition and judgment appear intact.  Cooperative with normal attention span and concentration.  Behavior appropriate. No anxious or depressed appearing.      Assessment & Plan:    Assessment  DM HTN Hyperlipidemia Endometriosis Insomnia: Hardly ever uses Ambien prn Back pain: Dr.Wang prn  OSA can't tolerate CPAP Menopausal H/o Chest pain 2008, low risk stress test  PLAN HTN: Since she started to do better with her lifestyle and lost weight, was able to stop amlodipine and subsequently HCTZ and potassium. Currently on lisinopril 10 mg, metoprolol. BP is definitely increasing. Plan: Increase lisinopril to 20 and subsequently 30 to 40 mg if not well controlled.  See AVS.  BMP in 3 weeks. Headache: Few days ago had "worst headache of her life".  Never exam today is normal.  I am concerned about the severe  headache in the context of elevated BP.  We will get a CT head stat.  If she has another severe headache like the one before she needs to let me  know or seek medical attention. Addendum: CT (-)

## 2018-03-14 NOTE — Assessment & Plan Note (Signed)
HTN: Since she started to do better with her lifestyle and lost weight, was able to stop amlodipine and subsequently HCTZ and potassium. Currently on lisinopril 10 mg, metoprolol. BP is definitely increasing. Plan: Increase lisinopril to 20 and subsequently 30 to 40 mg if not well controlled.  See AVS.  BMP in 3 weeks. Headache: Few days ago had "worst headache of her life".  Never exam today is normal.  I am concerned about the severe headache in the context of elevated BP.  We will get a CT head stat.  If she has another severe headache like the one before she needs to let me know or seek medical attention. Addendum: CT (-)

## 2018-04-03 ENCOUNTER — Other Ambulatory Visit: Payer: Medicare Other

## 2018-04-06 ENCOUNTER — Other Ambulatory Visit (INDEPENDENT_AMBULATORY_CARE_PROVIDER_SITE_OTHER): Payer: Medicare Other

## 2018-04-06 DIAGNOSIS — I10 Essential (primary) hypertension: Secondary | ICD-10-CM | POA: Diagnosis not present

## 2018-04-06 LAB — BASIC METABOLIC PANEL
BUN: 13 mg/dL (ref 6–23)
CO2: 30 mEq/L (ref 19–32)
Calcium: 10.4 mg/dL (ref 8.4–10.5)
Chloride: 101 mEq/L (ref 96–112)
Creatinine, Ser: 1.19 mg/dL (ref 0.40–1.20)
GFR: 58.3 mL/min — ABNORMAL LOW (ref >60.00)
Glucose, Bld: 89 mg/dL (ref 70–99)
Potassium: 5.2 mEq/L — ABNORMAL HIGH (ref 3.5–5.1)
Sodium: 140 mEq/L (ref 135–145)

## 2018-04-08 ENCOUNTER — Telehealth: Payer: Self-pay | Admitting: Internal Medicine

## 2018-04-08 MED ORDER — AMLODIPINE BESYLATE 5 MG PO TABS: 5.0000 mg | ORAL_TABLET | Freq: Every day | ORAL | 6 refills | Status: DC

## 2018-04-08 NOTE — Telephone Encounter (Signed)
I spoke with the patient, see her message:  Currently on lisinopril 20 mg 1 tablet BID BP is still elevated particularly the diastolic BP Plan: Continue lisinopril 20 mg twice daily, add amlodipine 5 mg daily, Rx sent.  Patient will call in a couple of weeks and let me know how her BPs are going.

## 2018-05-21 ENCOUNTER — Ambulatory Visit (INDEPENDENT_AMBULATORY_CARE_PROVIDER_SITE_OTHER): Payer: Medicare Other | Admitting: Internal Medicine

## 2018-05-21 ENCOUNTER — Encounter: Payer: Self-pay | Admitting: Internal Medicine

## 2018-05-21 VITALS — BP 116/80 | HR 88 | Temp 98.0°F | Resp 16 | Ht 61.0 in | Wt 176.2 lb

## 2018-05-21 DIAGNOSIS — E119 Type 2 diabetes mellitus without complications: Secondary | ICD-10-CM | POA: Diagnosis not present

## 2018-05-21 DIAGNOSIS — E875 Hyperkalemia: Secondary | ICD-10-CM

## 2018-05-21 DIAGNOSIS — E785 Hyperlipidemia, unspecified: Secondary | ICD-10-CM | POA: Diagnosis not present

## 2018-05-21 DIAGNOSIS — I10 Essential (primary) hypertension: Secondary | ICD-10-CM

## 2018-05-21 DIAGNOSIS — G4700 Insomnia, unspecified: Secondary | ICD-10-CM

## 2018-05-21 LAB — BASIC METABOLIC PANEL
BUN: 15 mg/dL (ref 6–23)
CO2: 28 mEq/L (ref 19–32)
CREATININE: 1.15 mg/dL (ref 0.40–1.20)
Calcium: 10 mg/dL (ref 8.4–10.5)
Chloride: 100 mEq/L (ref 96–112)
GFR: 57.04 mL/min — AB (ref >60.00)
Glucose, Bld: 92 mg/dL (ref 70–99)
Potassium: 5.7 mEq/L — ABNORMAL HIGH (ref 3.5–5.1)
Sodium: 138 mEq/L (ref 135–145)

## 2018-05-21 LAB — LIPID PANEL
CHOL/HDL RATIO: 3
Cholesterol: 150 mg/dL (ref 0–200)
HDL: 51.5 mg/dL (ref >39.00)
LDL Cholesterol: 82 mg/dL (ref 0–99)
NONHDL: 98
Triglycerides: 78 mg/dL (ref 0.0–149.0)
VLDL: 15.6 mg/dL (ref 0.0–40.0)

## 2018-05-21 LAB — ALT: ALT: 19 U/L (ref 0–35)

## 2018-05-21 LAB — HEMOGLOBIN A1C: Hgb A1c MFr Bld: 6.2 % (ref 4.6–6.5)

## 2018-05-21 LAB — AST: AST: 14 U/L (ref 0–37)

## 2018-05-21 MED ORDER — LISINOPRIL-HYDROCHLOROTHIAZIDE 20-12.5 MG PO TABS: 1.0000 | ORAL_TABLET | Freq: Every day | ORAL | 0 refills | Status: DC

## 2018-05-21 NOTE — Patient Instructions (Addendum)
Per our records you are due for an eye exam. Please contact your eye doctor to schedule an appointment. Please have them send copies of your office visit notes to [place]. Our fax number is [phone].     GO TO THE LAB : Get the blood work     GO TO THE FRONT DESK Schedule your next appointment   physical exam in 6 months   HEALTHY SLEEP Sleep hygiene: Basic rules for a good night's sleep  Sleep only as much as you need to feel rested and then get out of bed  Keep a regular sleep schedule  Avoid forcing sleep  Exercise regularly for at least 20 minutes, preferably 4 to 5 hours before bedtime  Avoid caffeinated beverages after lunch  Avoid alcohol near bedtime: no "night cap"  Avoid smoking, especially in the evening  Do not go to bed hungry  Adjust bedroom environment  Avoid prolonged use of light-emitting screens before bedtime   Deal with your worries before bedtime

## 2018-05-21 NOTE — Progress Notes (Signed)
Pre visit review using our clinic review tool, if applicable. No additional management support is needed unless otherwise documented below in the visit note. 

## 2018-05-21 NOTE — Progress Notes (Signed)
Subjective:    Patient ID: Nancy Martin, female    DOB: 11-Apr-1951, 68 y.o.   MRN: [id number]  DOS:  05/21/2018 Type of visit - description: Routine follow-up HTN: Good compliance with medications, ambulatory BPs are normal. Recently seen with headaches, they resolved. DM: Good compliance w/ medication, she was not very active at the end of 2019 but is going back to more active lifestyle. Insomnia: Still an issue. High cholesterol: Due for labs.   Review of Systems  Denies nausea vomiting Denies headache or dizziness. No fatigue No lower extremity paresthesias or numbness.  Past Medical History:  Diagnosis Date  . Anxiety   . Chest pain 2008   saw Dr.TraciTurner, stress test--low risk  . Diabetes mellitus, type 2 (HCC)   . Endometriosis   . Hyperlipemia   . Hypertension   . Insomnia   . Menopausal state   . Rectal polyp    h/o  . Sleep apnea    can't tolerate CPAP    Past Surgical History:  Procedure Laterality Date  . colonoscopy with polypectomy    . LAPAROTOMY     endometriosis  . ROTATOR CUFF REPAIR Right 09/2016  . TONSILLECTOMY      Social History   Socioeconomic History  . Marital status: Single    Spouse name: Not on file  . Number of children: 1  . Years of education: Not on file  . Highest education level: Not on file  Occupational History  . 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 Financial resource strain: Not on file  . Food insecurity:    Worry: Not on file    Inability: Not on file  . Transportation needs:    Medical: Not on file    Non-medical: Not on file  Tobacco Use  . Smoking status: Never Smoker  . Smokeless tobacco: Never Used  Substance and Sexual Activity  . Alcohol use: Yes    Comment: rarely  . Drug use: No  . Sexual activity: Not Currently  Lifestyle  . Physical activity:    Days per week: Not on file    Minutes per session: Not on file  . Stress: Not on file    Relationships  . Social connections:    Talks on phone: Not on file    Gets together: Not on file    Attends religious service: Not on file    Active member of club or organization: Not on file    Attends meetings of clubs or organizations: Not on file    Relationship status: Not on file  . Intimate partner violence:    Fear of current or ex partner: Not on file    Emotionally abused: Not on file    Physically abused: Not on file    Forced sexual activity: Not on file  Other Topics Concern  . Not on file  Social History Narrative   Lives by herself      Allergies as of 05/21/2018      Reactions   Shellfish Allergy Hives      Medication List       Accurate as of May 21, 2018 11:59 PM. Always use your most recent med list.        amLODipine 5 MG tablet Commonly known as:  NORVASC Take 1 tablet (5 mg total) by mouth daily.   aspirin EC 81 MG tablet Take 81 mg by mouth daily.  atorvastatin 20 MG tablet Commonly known as:  LIPITOR Take 1 tablet (20 mg total) by mouth daily.   glucose blood test strip Check blood sugar no more than twice daily   lisinopril-hydrochlorothiazide 20-12.5 MG tablet Commonly known as:  PRINZIDE,ZESTORETIC Take 1 tablet by mouth daily.   MELATONIN PO Take 1 tablet by mouth daily.   metFORMIN 1000 MG tablet Commonly known as:  GLUCOPHAGE Take 1 tablet (1,000 mg total) by mouth 2 (two) times daily with a meal.   metoprolol tartrate 25 MG tablet Commonly known as:  LOPRESSOR Take 1 tablet (25 mg total) by mouth 2 (two) times daily.   onetouch ultrasoft lancets Check blood sugars no more than twice daily.   pantoprazole 40 MG tablet Commonly known as:  PROTONIX Take 1 tablet (40 mg total) by mouth 2 (two) times daily before a meal.   Vitamin D3 125 MCG (5000 UT) Tabs Take 1 tablet by mouth daily.   zolpidem 10 MG tablet Commonly known as:  AMBIEN Take 1 tablet (10 mg total) by mouth at bedtime as needed for sleep.            Objective:   Physical Exam BP 116/80 (BP Location: Right Arm, Patient Position: Sitting, Cuff Size: Small)   Pulse 88   Temp 98 F (36.7 C) (Oral)   Resp 16   Ht 5\' 1"  (1.549 m)   Wt 176 lb 4 oz (79.9 kg)   SpO2 96%   BMI 33.30 kg/m  General:   Well developed, NAD, BMI noted. HEENT:  Normocephalic . Face symmetric, atraumatic Lungs:  CTA B Normal respiratory effort, no intercostal retractions, no accessory muscle use. Heart: RRR,  no murmur.  No pretibial edema bilaterally  Skin: Not pale. Not jaundice DIABETIC FEET EXAM: No lower extremity edema Normal pedal pulses bilaterally Skin normal, nails normal, no calluses Pinprick examination of the feet normal. Neurologic:  alert & oriented X3.  Speech normal, gait appropriate for age and unassisted Psych--  Cognition and judgment appear intact.  Cooperative with normal attention span and concentration.  Behavior appropriate. No anxious or depressed appearing.      Assessment     Assessment  DM HTN Hyperlipidemia Endometriosis Insomnia: Hardly ever uses Ambien prn Back pain: Dr.Wang prn  OSA can't tolerate CPAP Menopausal H/o Chest pain 2008, low risk stress test  PLAN DM: Currently on metformin, check A1c.  Feet exam negative. HTN: Since the last visit, she increase lisinopril to 40 mg, also on metoprolol and amlodipine.  Ambulatory BPs 113/85, 120/80.  She is doing well.  Check a BMP (addendum potassium 5.7, lisinopril 40 mg switch to lisinopril HCT 20/12.5) High cholesterol: On Lipitor, check FLP, AST, ALT Insomnia: On melatonin as needed, hardly ever uses Ambien, sleep is broken.  Recommend good habits, they were discussed with her.  Avoid caffeine, go to bed at the same time every day, avoid late the screen use, take melatonin daily. Preventive care: Bone density test 05/2017: T score -1.0 RTC 6 months CPX

## 2018-05-22 ENCOUNTER — Telehealth: Payer: Self-pay | Admitting: Internal Medicine

## 2018-05-22 NOTE — Assessment & Plan Note (Signed)
DM: Currently on metformin, check A1c.  Feet exam negative. HTN: Since the last visit, she increase lisinopril to 40 mg, also on metoprolol and amlodipine.  Ambulatory BPs 113/85, 120/80.  She is doing well.  Check a BMP (addendum potassium 5.7, lisinopril 40 mg switch to lisinopril HCT 20/12.5) High cholesterol: On Lipitor, check FLP, AST, ALT Insomnia: On melatonin as needed, hardly ever uses Ambien, sleep is broken.  Recommend good habits, they were discussed with her.  Avoid caffeine, go to bed at the same time every day, avoid late the screen use, take melatonin daily. Preventive care: Bone density test 05/2017: T score -1.0 RTC 6 months CPX

## 2018-05-22 NOTE — Telephone Encounter (Signed)
Copied from CRM [id number]. Topic: Quick Communication - See Telephone Encounter >> May 22, 2018  8:33 AM Aretta Nip wrote: CRM for notification. See Telephone encounter for: 05/22/18.lisinopril-hydrochlorothiazide (PRINZIDE,ZESTORETIC) 20-12.5 MG tablet This medication was changed at visit yesterday when she saw Dr Drue Novel to 40 instead of 20. Her blood pressure was up and he wanted to make an adjustment. She says she wants to make sure she understood him right and also to call in correct dosage. She said after visit paper was not correct yesterday and front desk told her they were busy and probably had not bee updated. If incorrect please call in correct dosage.

## 2018-05-22 NOTE — Telephone Encounter (Signed)
Attempted to call pt. To discuss Lisinopril.  Noted on result note of 1/30, Lisinopril 40 mg d/c'd, and changed to Lisinopril-HCTZ 20/12.5 mg qd, due to elevated Potassium.  Left voice message for pt. Re: the change in medication, per recommendation Dr. Drue Novel.  Encouraged to call back, if she has further questions.

## 2018-05-26 ENCOUNTER — Other Ambulatory Visit (INDEPENDENT_AMBULATORY_CARE_PROVIDER_SITE_OTHER): Payer: Medicare Other

## 2018-05-26 DIAGNOSIS — E875 Hyperkalemia: Secondary | ICD-10-CM

## 2018-05-26 LAB — BASIC METABOLIC PANEL
BUN: 20 mg/dL (ref 6–23)
CO2: 28 mEq/L (ref 19–32)
Calcium: 10.1 mg/dL (ref 8.4–10.5)
Chloride: 95 mEq/L — ABNORMAL LOW (ref 96–112)
Creatinine, Ser: 1.15 mg/dL (ref 0.40–1.20)
GFR: 57.04 mL/min — AB (ref >60.00)
Glucose, Bld: 84 mg/dL (ref 70–99)
Potassium: 4.7 mEq/L (ref 3.5–5.1)
Sodium: 135 mEq/L (ref 135–145)

## 2018-05-28 MED ORDER — LISINOPRIL-HYDROCHLOROTHIAZIDE 20-12.5 MG PO TABS: 1.0000 | ORAL_TABLET | Freq: Every day | ORAL | 1 refills | Status: DC

## 2018-05-28 NOTE — Addendum Note (Signed)
Addended byConrad Montpelier D on: 05/28/2018 07:44 AM   Modules accepted: Orders

## 2018-06-14 ENCOUNTER — Other Ambulatory Visit: Payer: Self-pay | Admitting: Internal Medicine

## 2018-07-06 ENCOUNTER — Other Ambulatory Visit: Payer: Self-pay | Admitting: Internal Medicine

## 2018-09-27 ENCOUNTER — Other Ambulatory Visit: Payer: Self-pay | Admitting: Internal Medicine

## 2018-10-16 ENCOUNTER — Other Ambulatory Visit: Payer: Self-pay | Admitting: Internal Medicine

## 2018-11-19 ENCOUNTER — Encounter: Payer: Self-pay | Admitting: Internal Medicine

## 2018-11-19 ENCOUNTER — Other Ambulatory Visit: Payer: Self-pay

## 2018-11-19 ENCOUNTER — Ambulatory Visit (INDEPENDENT_AMBULATORY_CARE_PROVIDER_SITE_OTHER): Payer: Medicare Other | Admitting: Internal Medicine

## 2018-11-19 VITALS — BP 139/88 | HR 80 | Temp 98.0°F | Resp 16 | Ht 61.0 in | Wt 173.1 lb

## 2018-11-19 DIAGNOSIS — E119 Type 2 diabetes mellitus without complications: Secondary | ICD-10-CM | POA: Diagnosis not present

## 2018-11-19 DIAGNOSIS — Z0000 Encounter for general adult medical examination without abnormal findings: Secondary | ICD-10-CM

## 2018-11-19 DIAGNOSIS — Z0100 Encounter for examination of eyes and vision without abnormal findings: Secondary | ICD-10-CM

## 2018-11-19 LAB — BASIC METABOLIC PANEL
BUN: 13 mg/dL (ref 6–23)
CO2: 27 mEq/L (ref 19–32)
Calcium: 9.7 mg/dL (ref 8.4–10.5)
Chloride: 98 mEq/L (ref 96–112)
Creatinine, Ser: 1.26 mg/dL — ABNORMAL HIGH (ref 0.40–1.20)
GFR: 51.25 mL/min — ABNORMAL LOW (ref >60.00)
Glucose, Bld: 87 mg/dL (ref 70–99)
Potassium: 4.9 mEq/L (ref 3.5–5.1)
Sodium: 135 mEq/L (ref 135–145)

## 2018-11-19 NOTE — Assessment & Plan Note (Addendum)
-  Td 07-2014  -PNM 2008 and 2015 - prevnar  03-2016  -Zostavax: October 2015 - shingrix discussed , pt prefers to wait    -Recommend flu shot early this fall -CCS: Cscope 07/2008.  Two polyps, hyperplastic.   Cscope 10-2014 @ [facility], no polyps, recheck 5 years d/t personal h/o polyps  - Female care: has not seen  gyn in a while, encouraged to do -DEXA normal 05/2017, on vit d -MMG 05/2017  -Diet is ok, not exercising , [employer] is closed, rec daily walks  -labs: BMP, CBC, A1c

## 2018-11-19 NOTE — Progress Notes (Signed)
Subjective:    Patient ID: Nancy Martin, female    DOB: 1951/02/02, 68 y.o.   MRN: [id number]  DOS:  11/19/2018 Type of visit - description: CPX In general feeling well. Good COVID-19 precautions Ambulatory BPs normal.  Review of Systems  A 14 point review of systems is negative   Past Medical History:  Diagnosis Date  . Anxiety   . Chest pain 2008   saw Dr.TraciTurner, stress test--low risk  . Diabetes mellitus, type 2 ([place])   . Endometriosis   . Hyperlipemia   . Hypertension   . Insomnia   . Menopausal state   . Rectal polyp    h/o  . Sleep apnea    can't tolerate CPAP    Past Surgical History:  Procedure Laterality Date  . colonoscopy with polypectomy    . LAPAROTOMY     endometriosis  . ROTATOR CUFF REPAIR Right 09/2016  . TONSILLECTOMY      Social History   Socioeconomic History  . Marital status: Single    Spouse name: Not on file  . Number of children: 1  . Years of education: Not on file  . Highest education level: Not on file  Occupational History  . 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 Financial resource strain: Not on file  . Food insecurity    Worry: Not on file    Inability: Not on file  . Transportation needs    Medical: Not on file    Non-medical: Not on file  Tobacco Use  . Smoking status: Never Smoker  . Smokeless tobacco: Never Used  Substance and Sexual Activity  . Alcohol use: Yes    Comment: rarely  . Drug use: No  . Sexual activity: Not Currently  Lifestyle  . Physical activity    Days per week: Not on file    Minutes per session: Not on file  . Stress: Not on file  Relationships  . Social Herbalist on phone: Not on file    Gets together: Not on file    Attends religious service: Not on file    Active member of club or organization: Not on file    Attends meetings of clubs or organizations: Not on file    Relationship status: Not on file  . Intimate  partner violence    Fear of current or ex partner: Not on file    Emotionally abused: Not on file    Physically abused: Not on file    Forced sexual activity: Not on file  Other Topics Concern  . Not on file  Social History Narrative   Lives by herself     Family History  Problem Relation Age of Onset  . Diabetes Mother   . Hyperlipidemia Mother   . Thyroid disease Mother   . Diabetes Father   . Hyperlipidemia Father   . Pancreatic cancer Father   . Stroke Brother 31       aneurysm  . Diabetes Brother   . Breast cancer Other   . Heart disease Neg Hx   . Colon cancer Neg Hx      Allergies as of 11/19/2018      Reactions   Shellfish Allergy Hives      Medication List       Accurate as of November 19, 2018 11:59 PM. If you have any questions, ask your nurse or doctor.  amLODipine 5 MG tablet Commonly known as: NORVASC Take 1 tablet (5 mg total) by mouth daily.   aspirin EC 81 MG tablet Take 81 mg by mouth daily.   atorvastatin 20 MG tablet Commonly known as: LIPITOR Take 1 tablet (20 mg total) by mouth daily.   glucose blood test strip Check blood sugar no more than twice daily   lisinopril-hydrochlorothiazide 20-12.5 MG tablet Commonly known as: ZESTORETIC Take 1 tablet by mouth daily.   MELATONIN PO Take 1 tablet by mouth daily.   metFORMIN 1000 MG tablet Commonly known as: GLUCOPHAGE Take 1 tablet (1,000 mg total) by mouth 2 (two) times daily with a meal.   metoprolol tartrate 25 MG tablet Commonly known as: LOPRESSOR Take 1 tablet (25 mg total) by mouth 2 (two) times daily.   onetouch ultrasoft lancets Check blood sugars no more than twice daily.   pantoprazole 40 MG tablet Commonly known as: PROTONIX Take 1 tablet (40 mg total) by mouth 2 (two) times daily before a meal.   Vitamin D3 125 MCG (5000 UT) Tabs Take 1 tablet by mouth daily.   zolpidem 10 MG tablet Commonly known as: AMBIEN Take 1 tablet (10 mg total) by mouth at bedtime  as needed for sleep.           Objective:   Physical Exam BP 139/88 (BP Location: Left Wrist, Patient Position: Sitting, Cuff Size: Small)   Pulse 80   Temp 98 F (36.7 C) (Oral)   Resp 16   Ht 5\' 1"  (1.549 m)   Wt 173 lb 2 oz (78.5 kg)   SpO2 99%   BMI 32.71 kg/m  General: Well developed, NAD, BMI noted Neck: No  thyromegaly  HEENT:  Normocephalic . Face symmetric, atraumatic Lungs:  CTA B Normal respiratory effort, no intercostal retractions, no accessory muscle use. Heart: RRR,  no murmur.  No pretibial edema bilaterally  Abdomen:  Not distended, soft, non-tender. No rebound or rigidity.   Skin: Exposed areas without rash. Not pale. Not jaundice Neurologic:  alert & oriented X3.  Speech normal, gait appropriate for age and unassisted Strength symmetric and appropriate for age.  Psych: Cognition and judgment appear intact.  Cooperative with normal attention span and concentration.  Behavior appropriate. No anxious or depressed appearing.     Assessment     Assessment  DM HTN Hyperlipidemia Endometriosis Insomnia: Hardly ever uses Ambien prn Back pain: Dr.Wang prn  OSA can't tolerate CPAP Menopausal H/o Chest pain 2008, low risk stress test  PLAN DM: Continue metformin, check A1c.  Refer to annual eye doctor HTN: Normal ambulatory BPs.  Continue amlodipine, Zestoretic, metoprolol, monitor ambulatory BPs.  Checking labs Hyperlipidemia: Controlled, on Lipitor Insomnia: Controlled with melatonin, hardly ever uses Ambien Mild anemia: Noted on last CBC, no symptoms, recheck. RTC 6 months

## 2018-11-19 NOTE — Patient Instructions (Addendum)
   GO TO THE LAB : Get the blood work     GO TO THE FRONT DESK Schedule your next appointment   for a checkup in 6 months     Check the  blood pressure 2 or 3 times a month   BP GOAL is between 110/65 and  135/85. If it is consistently higher or lower, let me know  We will refer you to a new eye doctor

## 2018-11-19 NOTE — Progress Notes (Signed)
Pre visit review using our clinic review tool, if applicable. No additional management support is needed unless otherwise documented below in the visit note. 

## 2018-11-20 NOTE — Assessment & Plan Note (Signed)
DM: Continue metformin, check A1c.  Refer to annual eye doctor HTN: Normal ambulatory BPs.  Continue amlodipine, Zestoretic, metoprolol, monitor ambulatory BPs.  Checking labs Hyperlipidemia: Controlled, on Lipitor Insomnia: Controlled with melatonin, hardly ever uses Ambien Mild anemia: Noted on last CBC, no symptoms, recheck. RTC 6 months

## 2018-11-26 LAB — HM DIABETES EYE EXAM

## 2018-12-01 ENCOUNTER — Encounter: Payer: Self-pay | Admitting: Internal Medicine

## 2018-12-16 ENCOUNTER — Other Ambulatory Visit: Payer: Self-pay | Admitting: Internal Medicine

## 2018-12-24 ENCOUNTER — Encounter: Payer: Self-pay | Admitting: Internal Medicine

## 2018-12-24 ENCOUNTER — Ambulatory Visit: Payer: Self-pay

## 2018-12-24 ENCOUNTER — Other Ambulatory Visit: Payer: Self-pay

## 2018-12-24 ENCOUNTER — Ambulatory Visit (INDEPENDENT_AMBULATORY_CARE_PROVIDER_SITE_OTHER): Payer: Medicare Other | Admitting: Internal Medicine

## 2018-12-24 ENCOUNTER — Ambulatory Visit (INDEPENDENT_AMBULATORY_CARE_PROVIDER_SITE_OTHER): Payer: Medicare Other

## 2018-12-24 DIAGNOSIS — T783XXA Angioneurotic edema, initial encounter: Secondary | ICD-10-CM

## 2018-12-24 DIAGNOSIS — Z23 Encounter for immunization: Secondary | ICD-10-CM

## 2018-12-24 MED ORDER — TRIAMTERENE-HCTZ 37.5-25 MG PO TABS: 1.0000 | ORAL_TABLET | Freq: Every day | ORAL | 1 refills | Status: DC

## 2018-12-24 MED ORDER — PREDNISONE 10 MG PO TABS: ORAL_TABLET | ORAL | 0 refills | Status: DC

## 2018-12-24 NOTE — Telephone Encounter (Signed)
FYI

## 2018-12-24 NOTE — Telephone Encounter (Signed)
Pt. Reports she noticed her bottom lip was swollen on the right. No pain or itching. Started using a different Dial soap.Does see a bug bite.Warm transfer to [facility] in the practice.  Answer Assessment - Initial Assessment Questions 1. ONSET: "When did the swelling start?" (e.g., minutes, hours, days)     Last night 2. SEVERITY: "How swollen is it?"     Bottom lip on the right 3. ITCHING: "Is there any itching?" If so, ask: "How much?"   (Scale 1-10; mild, moderate or severe)     No 4. PAIN: "Is the swelling painful to touch?" If so, ask: "How painful is it?"   (Scale 1-10; mild, moderate or severe)     No 5. CAUSE: "What do you think is causing the lip swelling?"     Unsure 6. RECURRENT SYMPTOM: "Have you had lip swelling before?" If so, ask: "When was the last time?" "What happened that time?"     No 7. OTHER SYMPTOMS: "Do you have any other symptoms?" (e.g., toothache)     No 8. PREGNANCY: "Is there any chance you are pregnant?" "When was your last menstrual period?"     No  Protocols used: [facility]

## 2018-12-24 NOTE — Progress Notes (Signed)
Subjective:    Patient ID: Nancy Martin, female    DOB: 08/17/1950, 68 y.o.   MRN: [id number]  DOS:  12/24/2018 Type of visit - description: Attempted  to make this a video visit, due to technical difficulties from the patient side it was not possible  thus we proceeded with a Virtual Visit via Telephone    I connected with@   by telephone and verified that I am speaking with the correct person using two identifiers.  THIS ENCOUNTER IS A VIRTUAL VISIT DUE TO COVID-19 - PATIENT WAS NOT SEEN IN THE OFFICE. PATIENT HAS CONSENTED TO VIRTUAL VISIT / TELEMEDICINE VISIT   Location of patient: home  Location of provider: office  I discussed the limitations, risks, security and privacy concerns of performing an evaluation and management service by telephone and the availability of in person appointments. I also discussed with the patient that there may be a patient responsible charge related to this service. The patient expressed understanding and agreed to proceed.   History of Present Illness: Acute Yesterday, she developed swelling at the bottom lip, right side. The lip was not red or warm. Today is a little less swollen. Today she had a flu shot without apparent side effects.    Review of Systems  Denies fever chills No tongue swelling No shortness of breath, difficulty swallowing or difficulty breathing.  Past Medical History:  Diagnosis Date  . Anxiety   . Chest pain 2008   saw Dr.TraciTurner, stress test--low risk  . Diabetes mellitus, type 2 (HCC)   . Endometriosis   . Hyperlipemia   . Hypertension   . Insomnia   . Menopausal state   . Rectal polyp    h/o  . Sleep apnea    can't tolerate CPAP    Past Surgical History:  Procedure Laterality Date  . colonoscopy with polypectomy    . LAPAROTOMY     endometriosis  . ROTATOR CUFF REPAIR Right 09/2016  . TONSILLECTOMY      Social History   Socioeconomic History  . Marital status: Single    Spouse name: Not on  file  . Number of children: 1  . Years of education: Not on file  . Highest education level: Not on file  Occupational History  . 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 Financial resource strain: Not on file  . Food insecurity    Worry: Not on file    Inability: Not on file  . Transportation needs    Medical: Not on file    Non-medical: Not on file  Tobacco Use  . Smoking status: Never Smoker  . Smokeless tobacco: Never Used  Substance and Sexual Activity  . Alcohol use: Yes    Comment: rarely  . Drug use: No  . Sexual activity: Not Currently  Lifestyle  . Physical activity    Days per week: Not on file    Minutes per session: Not on file  . Stress: Not on file  Relationships  . Social Musicianconnections    Talks on phone: Not on file    Gets together: Not on file    Attends religious service: Not on file    Active member of club or organization: Not on file    Attends meetings of clubs or organizations: Not on file    Relationship status: Not on file  . Intimate partner violence    Fear of current or ex partner:  Not on file    Emotionally abused: Not on file    Physically abused: Not on file    Forced sexual activity: Not on file  Other Topics Concern  . Not on file  Social History Narrative   Lives by herself      Allergies as of 12/24/2018      Reactions   Shellfish Allergy Hives      Medication List       Accurate as of December 24, 2018  4:13 PM. If you have any questions, ask your nurse or doctor.        amLODipine 5 MG tablet Commonly known as: NORVASC Take 1 tablet (5 mg total) by mouth daily.   aspirin EC 81 MG tablet Take 81 mg by mouth daily.   atorvastatin 20 MG tablet Commonly known as: LIPITOR Take 1 tablet (20 mg total) by mouth daily.   glucose blood test strip Check blood sugar no more than twice daily   lisinopril-hydrochlorothiazide 20-12.5 MG tablet Commonly known as: ZESTORETIC  Take 1 tablet by mouth daily.   MELATONIN PO Take 1 tablet by mouth daily.   metFORMIN 1000 MG tablet Commonly known as: GLUCOPHAGE Take 1 tablet (1,000 mg total) by mouth 2 (two) times daily with a meal.   metoprolol tartrate 25 MG tablet Commonly known as: LOPRESSOR Take 1 tablet (25 mg total) by mouth 2 (two) times daily.   onetouch ultrasoft lancets Check blood sugars no more than twice daily.   pantoprazole 40 MG tablet Commonly known as: PROTONIX Take 1 tablet (40 mg total) by mouth 2 (two) times daily before a meal.   Vitamin D3 125 MCG (5000 UT) Tabs Take 1 tablet by mouth daily.   zolpidem 10 MG tablet Commonly known as: AMBIEN Take 1 tablet (10 mg total) by mouth at bedtime as needed for sleep.           Objective:   Physical Exam There were no vitals taken for this visit. This is phone virtual visit, she sounded well, alert oriented x3.  In no distress    Assessment      Assessment  DM HTN Hyperlipidemia Endometriosis Insomnia: Hardly ever uses Ambien prn Back pain: Dr.Wang prn  OSA can't tolerate CPAP Menopausal H/o Chest pain 2008, low risk stress test  PLAN Angioedema: Lip swelling as described above in the context of taking lisinopril HCT.  Lisinopril is likely the culprit. Plan: Stop lisinopril HCT, Benadryl 3 times in the next 24 hours, round of prednisone. Call  911 if she gets more swelling or she has tongue swelling or difficulty breathing.  She verbalized understanding HTN: Stop lisinopril HCT, continue amlodipine, metoprolol.  Add Maxide, office visit in 2 weeks. Message sent with summary of recommendations RTC 2 weeks   I discussed the assessment and treatment plan with the patient. The patient was provided an opportunity to ask questions and all were answered. The patient agreed with the plan and demonstrated an understanding of the instructions.   The patient was advised to call back or seek an in-person evaluation if the symptoms  worsen or if the condition fails to improve as anticipated.  I provided 18 minutes of non-face-to-face time during this encounter.  Kathlene November, MD

## 2018-12-24 NOTE — Telephone Encounter (Signed)
Pt. Did not see a bug bite to lip.

## 2018-12-25 DIAGNOSIS — T783XXA Angioneurotic edema, initial encounter: Secondary | ICD-10-CM | POA: Insufficient documentation

## 2018-12-25 NOTE — Assessment & Plan Note (Signed)
Angioedema: Lip swelling as described above in the context of taking lisinopril HCT.  Lisinopril is likely the culprit. Plan: Stop lisinopril HCT, Benadryl 3 times in the next 24 hours, round of prednisone. Call  911 if she gets more swelling or she has tongue swelling or difficulty breathing.  She verbalized understanding HTN: Stop lisinopril HCT, continue amlodipine, metoprolol.  Add Maxide, office visit in 2 weeks. Message sent with summary of recommendations RTC 2 weeks

## 2019-01-22 ENCOUNTER — Ambulatory Visit: Payer: Medicare Other | Admitting: Internal Medicine

## 2019-02-18 ENCOUNTER — Other Ambulatory Visit: Payer: Self-pay | Admitting: Internal Medicine

## 2019-02-18 NOTE — Telephone Encounter (Signed)
15 day supply only. See OV note from 12/24/2018- Pt was to return to clinic in 2 weeks for recheck- she never returned. Further refills w/ OV.

## 2019-02-23 ENCOUNTER — Other Ambulatory Visit: Payer: Self-pay

## 2019-02-23 ENCOUNTER — Other Ambulatory Visit: Payer: Self-pay | Admitting: Internal Medicine

## 2019-02-23 MED ORDER — TRIAMTERENE-HCTZ 37.5-25 MG PO TABS: 1.0000 | ORAL_TABLET | Freq: Every day | ORAL | 0 refills | Status: DC

## 2019-02-23 MED ORDER — AMLODIPINE BESYLATE 5 MG PO TABS: 5.0000 mg | ORAL_TABLET | Freq: Every day | ORAL | 0 refills | Status: DC

## 2019-02-24 ENCOUNTER — Other Ambulatory Visit: Payer: Self-pay

## 2019-02-25 ENCOUNTER — Ambulatory Visit (INDEPENDENT_AMBULATORY_CARE_PROVIDER_SITE_OTHER): Payer: Medicare Other | Admitting: Internal Medicine

## 2019-02-25 ENCOUNTER — Encounter: Payer: Self-pay | Admitting: Internal Medicine

## 2019-02-25 ENCOUNTER — Other Ambulatory Visit: Payer: Self-pay

## 2019-02-25 VITALS — BP 138/85 | HR 83 | Temp 97.5°F | Resp 16 | Ht 61.0 in | Wt 181.2 lb

## 2019-02-25 DIAGNOSIS — G4700 Insomnia, unspecified: Secondary | ICD-10-CM

## 2019-02-25 DIAGNOSIS — I10 Essential (primary) hypertension: Secondary | ICD-10-CM

## 2019-02-25 DIAGNOSIS — E119 Type 2 diabetes mellitus without complications: Secondary | ICD-10-CM

## 2019-02-25 LAB — CBC WITH DIFFERENTIAL/PLATELET
Basophils Absolute: 0 10*3/uL (ref 0.0–0.1)
Basophils Relative: 0.7 % (ref 0.0–3.0)
Eosinophils Absolute: 0.3 10*3/uL (ref 0.0–0.7)
Eosinophils Relative: 4.8 % (ref 0.0–5.0)
HCT: 39.9 % (ref 36.0–46.0)
Hemoglobin: 13 g/dL (ref 12.0–15.0)
Lymphocytes Relative: 34.6 % (ref 12.0–46.0)
Lymphs Abs: 2 10*3/uL (ref 0.7–4.0)
MCHC: 32.6 g/dL (ref 30.0–36.0)
MCV: 90.6 fl (ref 78.0–100.0)
Monocytes Absolute: 0.4 10*3/uL (ref 0.1–1.0)
Monocytes Relative: 7.5 % (ref 3.0–12.0)
Neutro Abs: 3.1 10*3/uL (ref 1.4–7.7)
Neutrophils Relative %: 52.4 % (ref 43.0–77.0)
Platelets: 304 10*3/uL (ref 150.0–400.0)
RBC: 4.41 Mil/uL (ref 3.87–5.11)
RDW: 14.5 % (ref 11.5–15.5)
WBC: 5.9 10*3/uL (ref 4.0–10.5)

## 2019-02-25 LAB — BASIC METABOLIC PANEL
BUN: 20 mg/dL (ref 6–23)
CO2: 30 mEq/L (ref 19–32)
Calcium: 9.6 mg/dL (ref 8.4–10.5)
Chloride: 102 mEq/L (ref 96–112)
Creatinine, Ser: 1.2 mg/dL (ref 0.40–1.20)
GFR: 54.18 mL/min — ABNORMAL LOW (ref >60.00)
Glucose, Bld: 110 mg/dL — ABNORMAL HIGH (ref 70–99)
Potassium: 3.8 mEq/L (ref 3.5–5.1)
Sodium: 139 mEq/L (ref 135–145)

## 2019-02-25 LAB — HEMOGLOBIN A1C: Hgb A1c MFr Bld: 6.2 % (ref 4.6–6.5)

## 2019-02-25 MED ORDER — SHINGRIX 50 MCG/0.5ML IM SUSR: 0.5000 mL | Freq: Once | INTRAMUSCULAR | 1 refills | Status: AC

## 2019-02-25 MED ORDER — ATORVASTATIN CALCIUM 20 MG PO TABS: 20.0000 mg | ORAL_TABLET | Freq: Every day | ORAL | 2 refills | Status: DC

## 2019-02-25 MED ORDER — METOPROLOL TARTRATE 25 MG PO TABS: 25.0000 mg | ORAL_TABLET | Freq: Two times a day (BID) | ORAL | 2 refills | Status: DC

## 2019-02-25 MED ORDER — AMLODIPINE BESYLATE 5 MG PO TABS: 5.0000 mg | ORAL_TABLET | Freq: Every day | ORAL | 2 refills | Status: DC

## 2019-02-25 MED ORDER — PANTOPRAZOLE SODIUM 40 MG PO TBEC: 40.0000 mg | DELAYED_RELEASE_TABLET | Freq: Two times a day (BID) | ORAL | 3 refills | Status: DC

## 2019-02-25 MED ORDER — TRIAMTERENE-HCTZ 37.5-25 MG PO TABS: 1.0000 | ORAL_TABLET | Freq: Every day | ORAL | 2 refills | Status: DC

## 2019-02-25 MED ORDER — METFORMIN HCL 1000 MG PO TABS: 1000.0000 mg | ORAL_TABLET | Freq: Two times a day (BID) | ORAL | 2 refills | Status: DC

## 2019-02-25 NOTE — Progress Notes (Signed)
Pre visit review using our clinic review tool, if applicable. No additional management support is needed unless otherwise documented below in the visit note. 

## 2019-02-25 NOTE — Patient Instructions (Addendum)
Please schedule Medicare Wellness with Glenard Haring.   GO TO THE LAB : Get the blood work     GO TO THE FRONT DESK Schedule your next appointment physical exam in July 2021.  Call sooner if needed.  Continue checking your BPs BP GOAL is between 110/65 and  135/85. If it is consistently higher or lower, let me know

## 2019-02-25 NOTE — Progress Notes (Signed)
Subjective:    Patient ID: Nancy Martin, female    DOB: 1951/03/17, 68 y.o.   MRN: [id number]  DOS:  02/25/2019 Type of visit - description: Follow-up HTN: ACE inhibitors were stopped due to angioedema, Maxide added, no apparent side effects, ambulatory blood pressures very good Insomnia: Mostly taking melatonin, occasionally wakes up in the middle of the night   Review of Systems Denies chest pain, difficulty breathing No nausea, vomiting, diarrhea No lower extremity paresthesias  Past Medical History:  Diagnosis Date  . Anxiety   . Chest pain 2008   saw Dr.TraciTurner, stress test--low risk  . Diabetes mellitus, type 2 (HCC)   . Endometriosis   . Hyperlipemia   . Hypertension   . Insomnia   . Menopausal state   . Rectal polyp    h/o  . Sleep apnea    can't tolerate CPAP    Past Surgical History:  Procedure Laterality Date  . colonoscopy with polypectomy    . LAPAROTOMY     endometriosis  . ROTATOR CUFF REPAIR Right 09/2016  . TONSILLECTOMY      Social History   Socioeconomic History  . Marital status: Single    Spouse name: Not on file  . Number of children: 1  . Years of education: Not on file  . Highest education level: Not on file  Occupational History  . 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 Financial resource strain: Not on file  . Food insecurity    Worry: Not on file    Inability: Not on file  . Transportation needs    Medical: Not on file    Non-medical: Not on file  Tobacco Use  . Smoking status: Never Smoker  . Smokeless tobacco: Never Used  Substance and Sexual Activity  . Alcohol use: Yes    Comment: rarely  . Drug use: No  . Sexual activity: Not Currently  Lifestyle  . Physical activity    Days per week: Not on file    Minutes per session: Not on file  . Stress: Not on file  Relationships  . Social Musician on phone: Not on file    Gets together: Not on file     Attends religious service: Not on file    Active member of club or organization: Not on file    Attends meetings of clubs or organizations: Not on file    Relationship status: Not on file  . Intimate partner violence    Fear of current or ex partner: Not on file    Emotionally abused: Not on file    Physically abused: Not on file    Forced sexual activity: Not on file  Other Topics Concern  . Not on file  Social History Narrative   Lives by herself      Allergies as of 02/25/2019      Reactions   Lisinopril    Angioedema, see office visit 12/24/2018   Shellfish Allergy Hives      Medication List       Accurate as of February 25, 2019 11:59 PM. If you have any questions, ask your nurse or doctor.        STOP taking these medications   predniSONE 10 MG tablet Commonly known as: DELTASONE Stopped by: Willow Ora, MD     TAKE these medications   amLODipine 5 MG tablet Commonly known as: NORVASC Take 1 tablet (  5 mg total) by mouth daily.   aspirin EC 81 MG tablet Take 81 mg by mouth daily.   atorvastatin 20 MG tablet Commonly known as: LIPITOR Take 1 tablet (20 mg total) by mouth daily.   glucose blood test strip Check blood sugar no more than twice daily   MELATONIN PO Take 1 tablet by mouth daily.   metFORMIN 1000 MG tablet Commonly known as: GLUCOPHAGE Take 1 tablet (1,000 mg total) by mouth 2 (two) times daily with a meal.   metoprolol tartrate 25 MG tablet Commonly known as: LOPRESSOR Take 1 tablet (25 mg total) by mouth 2 (two) times daily.   onetouch ultrasoft lancets Check blood sugars no more than twice daily.   pantoprazole 40 MG tablet Commonly known as: PROTONIX Take 1 tablet (40 mg total) by mouth 2 (two) times daily before a meal.   Shingrix injection Generic drug: Zoster Vaccine Adjuvanted Inject 0.5 mLs into the muscle once for 1 dose. Started by: Kathlene November, MD   triamterene-hydrochlorothiazide 37.5-25 MG tablet Commonly known as:  MAXZIDE-25 Take 1 tablet by mouth daily.   Vitamin D3 125 MCG (5000 UT) Tabs Take 1 tablet by mouth daily.   zolpidem 10 MG tablet Commonly known as: AMBIEN Take 1 tablet (10 mg total) by mouth at bedtime as needed for sleep.           Objective:   Physical Exam BP 138/85 (BP Location: Left Arm, Patient Position: Sitting, Cuff Size: Normal)   Pulse 83   Temp (!) 97.5 F (36.4 C) (Temporal)   Resp 16   Ht 5\' 1"  (1.549 m)   Wt 181 lb 4 oz (82.2 kg)   SpO2 100%   BMI 34.25 kg/m  General:   Well developed, NAD, BMI noted. HEENT:  Normocephalic . Face covered by mask Lungs: ymmetric CTA B Normal respiratory effort, no intercostal retractions, no accessory muscle use. Heart: RRR,  no murmur.  No pretibial edema bilaterally  Skin: Not pale. Not jaundice DM foot exam: No edema, good pedal pulses, pinprick normal Neurologic:  alert & oriented X3.  Speech normal, gait appropriate for age and unassisted Psych--  Cognition and judgment appear intact.  Cooperative with normal attention span and concentration.  Behavior appropriate. No anxious or depressed appearing.      Assessment     Assessment  DM HTN Angioedema 12-2018, DC lisinopril Hyperlipidemia Endometriosis Insomnia: Hardly ever uses Ambien prn Back pain: Dr.Wang prn  OSA can't tolerate CPAP Menopausal H/o Chest pain 2008, low risk stress test  PLAN DM: Currently on Metformin , check A1c, feet exam negative. HTN: See last visit, lisinopril discontinued, no further angioedema, Maxide was added.  She is also on amlodipine, metoprolol.  BP is very good.  Check a BMP, CBC Insomnia: On melatonin, hardly ever uses Ambien Preventive care: Request Shingrix, prescription printed. RTC 10-2019 CPX

## 2019-02-26 NOTE — Assessment & Plan Note (Signed)
DM: Currently on Metformin , check A1c, feet exam negative. HTN: See last visit, lisinopril discontinued, no further angioedema, Maxide was added.  She is also on amlodipine, metoprolol.  BP is very good.  Check a BMP, CBC Insomnia: On melatonin, hardly ever uses Ambien Preventive care: Request Shingrix, prescription printed. RTC 10-2019 CPX

## 2019-03-03 ENCOUNTER — Ambulatory Visit: Payer: Self-pay | Admitting: *Deleted

## 2019-03-03 MED ORDER — HYDROCORTISONE 2.5 % EX CREA: TOPICAL_CREAM | Freq: Two times a day (BID) | CUTANEOUS | 0 refills | Status: AC

## 2019-03-03 NOTE — Telephone Encounter (Signed)
Spoke w/ Pt- informed of recommendations. Pt verbalized understanding.  

## 2019-03-03 NOTE — Telephone Encounter (Signed)
Seems like she has a local reaction to her Shingrix shot. I'm sending a prescription for topical steroid Tylenol as needed, ice as needed, call if not gradually better.

## 2019-03-03 NOTE — Addendum Note (Signed)
Addended by: Kathlene November E on: 03/03/2019 08:13 AM   Modules accepted: Orders

## 2019-03-03 NOTE — Telephone Encounter (Signed)
  I returned pt's call.   She had her Shingles injection last Thursday in her right arm and it's still very red, sore and hot feeling.   She felt bad over the weekend but feels better now.   Denies fever.    I went over the home care advice however since she is still c/o soreness and the site being red and hot and it's the 6th day since the injection was given I have sent a high priority note to Dr. Ethel Rana office so they can return the call when the office opens.  She stated the site is itching on and off.   I let her know she could take an antihistamine to help with the itching if she needed it.     The pt was agreeable to this plan.  High priority note sent to Dr. Ethel Rana office.   Reason for Disposition . Shingles (Herpes zoster; Shingrix) vaccine reactions  Answer Assessment - Initial Assessment Questions 1. SYMPTOMS: "What is the main symptom?" (e.g., redness, swelling, pain)      My right arm is red, sore and hot after getting the Shingles shot last THursday.   2. ONSET: "When was the vaccine (shot) given?" "How much later did the Last Thursday begin?" (e.g., hours, days ago)      Last Thursday 3. SEVERITY: "How bad is it?"      My right arm is sore, red and hot 4. FEVER: "Is there a fever?" If so, ask: "What is it, how was it measured, and when did it start?"      No 5. IMMUNIZATIONS GIVEN: "What shots have you recently received?"     Just Shingles 6. PAST REACTIONS: "Have you reacted to immunizations before?" If so, ask: "What happened?"     No 7. OTHER SYMPTOMS: "Do you have any other symptoms?"     Ringing in my ears on Saturday morning.   No ringing since Saturday.  Protocols used: IMMUNIZATION REACTIONS-A-AH

## 2019-03-03 NOTE — Telephone Encounter (Signed)
Please advise 

## 2019-03-15 ENCOUNTER — Telehealth: Payer: Self-pay

## 2019-03-15 NOTE — Telephone Encounter (Signed)
Okay 

## 2019-03-15 NOTE — Telephone Encounter (Signed)
Just an FYI

## 2019-03-15 NOTE — Telephone Encounter (Signed)
Copied from [place] [id number]. 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

## 2019-03-16 ENCOUNTER — Other Ambulatory Visit: Payer: Self-pay

## 2019-03-16 DIAGNOSIS — Z20822 Contact with and (suspected) exposure to covid-19: Secondary | ICD-10-CM

## 2019-03-18 LAB — NOVEL CORONAVIRUS, NAA: SARS-CoV-2, NAA: NOT DETECTED

## 2019-04-29 ENCOUNTER — Telehealth: Payer: Self-pay

## 2019-04-29 NOTE — Telephone Encounter (Signed)
Copied from CRM [id number]. Topic: General - Inquiry >> Apr 29, 2019 11:38 AM Nancy Martin wrote: Reason for CRM: Pt called to inquire about if Dr. Drue Novel would suggest for her to get the vaccine when available to her/ Pt has concerns about her reactions to certain medications and ingredients and her allergies to different things/ please advise

## 2019-04-30 NOTE — Telephone Encounter (Signed)
Patient advised and voiced understanding.  

## 2019-04-30 NOTE — Telephone Encounter (Signed)
Major contraindications are related reaction to other immunizations, I do not think this is the case, I definitely recommend the Covid immunization, of course needs to inform the provider who will give her a shot that she is allergic to shellfish and lisinopril

## 2019-04-30 NOTE — Telephone Encounter (Signed)
LM for pt to returncall

## 2019-05-04 ENCOUNTER — Other Ambulatory Visit: Payer: Self-pay | Admitting: Internal Medicine

## 2019-05-14 ENCOUNTER — Ambulatory Visit: Payer: Medicare Other | Admitting: Internal Medicine

## 2019-07-30 ENCOUNTER — Telehealth: Payer: Self-pay | Admitting: Internal Medicine

## 2019-07-30 MED ORDER — AMLODIPINE BESYLATE 5 MG PO TABS: 5.0000 mg | ORAL_TABLET | Freq: Every day | ORAL | 2 refills | Status: DC

## 2019-07-30 NOTE — Telephone Encounter (Signed)
Rx sent 

## 2019-07-30 NOTE — Telephone Encounter (Signed)
Patient is calling regarding amLODipine (NORVASC) 5 MG tablet. Pharmacy is stating she has to see the Dr. Drue Novel before refilling . Please advise

## 2019-08-10 ENCOUNTER — Other Ambulatory Visit: Payer: Self-pay | Admitting: Internal Medicine

## 2019-08-10 DIAGNOSIS — Z1231 Encounter for screening mammogram for malignant neoplasm of breast: Secondary | ICD-10-CM

## 2019-08-12 ENCOUNTER — Other Ambulatory Visit: Payer: Self-pay

## 2019-08-12 ENCOUNTER — Ambulatory Visit
Admission: RE | Admit: 2019-08-12 | Discharge: 2019-08-12 | Disposition: A | Discharge status: Home / Self Care | Payer: Medicare Other | Source: Ambulatory Visit | Attending: Internal Medicine | Admitting: Internal Medicine

## 2019-08-12 DIAGNOSIS — Z1231 Encounter for screening mammogram for malignant neoplasm of breast: Secondary | ICD-10-CM

## 2019-10-28 ENCOUNTER — Ambulatory Visit: Payer: Medicare Other | Admitting: Internal Medicine

## 2019-11-02 ENCOUNTER — Encounter: Payer: Self-pay | Admitting: Internal Medicine

## 2019-11-02 ENCOUNTER — Telehealth (INDEPENDENT_AMBULATORY_CARE_PROVIDER_SITE_OTHER): Payer: Medicare Other | Admitting: Internal Medicine

## 2019-11-02 ENCOUNTER — Other Ambulatory Visit: Payer: Self-pay

## 2019-11-02 VITALS — Ht 61.0 in

## 2019-11-02 DIAGNOSIS — G4700 Insomnia, unspecified: Secondary | ICD-10-CM

## 2019-11-02 DIAGNOSIS — I10 Essential (primary) hypertension: Secondary | ICD-10-CM

## 2019-11-02 DIAGNOSIS — E119 Type 2 diabetes mellitus without complications: Secondary | ICD-10-CM | POA: Diagnosis not present

## 2019-11-02 MED ORDER — ZOLPIDEM TARTRATE 10 MG PO TABS: 10.0000 mg | ORAL_TABLET | Freq: Every evening | ORAL | 0 refills | Status: AC | PRN

## 2019-11-02 MED ORDER — METFORMIN HCL 1000 MG PO TABS: ORAL_TABLET | ORAL | 1 refills | Status: AC

## 2019-11-02 MED ORDER — TRIAMTERENE-HCTZ 37.5-25 MG PO TABS: 1.0000 | ORAL_TABLET | Freq: Every day | ORAL | 1 refills | Status: AC

## 2019-11-02 MED ORDER — ATORVASTATIN CALCIUM 20 MG PO TABS: 20.0000 mg | ORAL_TABLET | Freq: Every day | ORAL | 1 refills | Status: AC

## 2019-11-02 MED ORDER — AMLODIPINE BESYLATE 5 MG PO TABS: 5.0000 mg | ORAL_TABLET | Freq: Every day | ORAL | 1 refills | Status: AC

## 2019-11-02 MED ORDER — METOPROLOL TARTRATE 25 MG PO TABS: 25.0000 mg | ORAL_TABLET | Freq: Two times a day (BID) | ORAL | 1 refills | Status: AC

## 2019-11-02 MED ORDER — PANTOPRAZOLE SODIUM 40 MG PO TBEC: 40.0000 mg | DELAYED_RELEASE_TABLET | Freq: Two times a day (BID) | ORAL | 1 refills | Status: AC

## 2019-11-02 NOTE — Progress Notes (Signed)
Subjective:    Patient ID: Nancy Martin, female    DOB: 05/20/1950, 69 y.o.   MRN: [id number]  DOS:  11/02/2019 Type of visit - description: Virtual Visit via Telephone    I connected with above mentioned patient  by telephone and verified that I am speaking with the correct person using two identifiers.  THIS ENCOUNTER IS A VIRTUAL VISIT DUE TO COVID-19 - PATIENT WAS NOT SEEN IN THE OFFICE. PATIENT HAS CONSENTED TO VIRTUAL VISIT / TELEMEDICINE VISIT   Location of patient: home  Location of provider: office  Persons participating in the virtual visit: patient, provider   I discussed the limitations, risks, security and privacy concerns of performing an evaluation and management service by telephone and the availability of in person appointments. I also discussed with the patient that there may be a patient responsible charge related to this service. The patient expressed understanding and agreed to proceed.  Acute The patient moved to [place] and is in need for refills. In general feeling well. No recent ambulatory BPs. Not sleeping well, took some melatonin without much help so she took a leftover Ambien, it worked, needs a refill. GERD: Occasionally has difficulty swallowing solids, no liquids.  No odynophagia.  No weight loss, nausea, vomiting or blood in the stools.   Review of Systems See above   Past Medical History:  Diagnosis Date  . Anxiety   . Chest pain 2008   saw Dr.TraciTurner, stress test--low risk  . Diabetes mellitus, type 2 (HCC)   . Endometriosis   . Hyperlipemia   . Hypertension   . Insomnia   . Menopausal state   . Rectal polyp    h/o  . Sleep apnea    can't tolerate CPAP    Past Surgical History:  Procedure Laterality Date  . colonoscopy with polypectomy    . LAPAROTOMY     endometriosis  . ROTATOR CUFF REPAIR Right 09/2016  . TONSILLECTOMY      Allergies as of 11/02/2019      Reactions   Lisinopril    Angioedema, see office visit  12/24/2018   Shellfish Allergy Hives      Medication List       Accurate as of November 02, 2019 11:59 PM. If you have any questions, ask your nurse or doctor.        amLODipine 5 MG tablet Commonly known as: NORVASC Take 1 tablet (5 mg total) by mouth daily.   aspirin EC 81 MG tablet Take 81 mg by mouth daily.   atorvastatin 20 MG tablet Commonly known as: LIPITOR Take 1 tablet (20 mg total) by mouth daily.   glucose blood test strip Check blood sugar no more than twice daily   hydrocortisone 2.5 % cream Apply topically 2 (two) times daily.   MELATONIN PO Take 1 tablet by mouth daily.   metFORMIN 1000 MG tablet Commonly known as: GLUCOPHAGE TAKE ONE TABLET BY MOUTH TWICE A DAY WITH A MEAL   metoprolol tartrate 25 MG tablet Commonly known as: LOPRESSOR Take 1 tablet (25 mg total) by mouth 2 (two) times daily.   onetouch ultrasoft lancets Check blood sugars no more than twice daily.   pantoprazole 40 MG tablet Commonly known as: PROTONIX Take 1 tablet (40 mg total) by mouth 2 (two) times daily before a meal.   triamterene-hydrochlorothiazide 37.5-25 MG tablet Commonly known as: MAXZIDE-25 Take 1 tablet by mouth daily.   Vitamin D3 125 MCG (5000 UT) Tabs Take 1  tablet by mouth daily.   zolpidem 10 MG tablet Commonly known as: AMBIEN Take 1 tablet (10 mg total) by mouth at bedtime as needed for sleep.          Objective:   Physical Exam Ht 5\' 1"  (1.549 m)   BMI 34.25 kg/m  This is a telephone virtual visit, she is alert oriented x3, in no apparent distress    Assessment     Assessment  DM HTN Angioedema 12-2018, DC lisinopril Hyperlipidemia Endometriosis Insomnia: Hardly ever uses Ambien prn Back pain: Dr.Wang prn  OSA can't tolerate CPAP Menopausal H/o Chest pain 2008, low risk stress test  PLAN DM, HTN, hyperlipidemia, GERD: The patient requests refills, she moved to [place] and is in the process of finding a new doctor. No recent  ambulatory BPs, the last time she checked was few weeks ago and it was "slightly low". No recent ambulatory CBGs. Has some dysphagia as described above but no weight loss or blood in the stools. Plan: Refill all meds for 2 months, Ambien for 1 month. Encouraged to start checking ambulatory CBGs and BPs. She plans to establish with a new PCP within the next 2 months but nevertheless she could call me if needed. Encouraged to discuss dysphagia with new PCP.     I discussed the assessment and treatment plan with the patient. The patient was provided an opportunity to ask questions and all were answered. The patient agreed with the plan and demonstrated an understanding of the instructions.   The patient was advised to call back or seek an in-person evaluation if the symptoms worsen or if the condition fails to improve as anticipated.  I provided 18  minutes of non-face-to-face time during this encounter.  [place], MD

## 2019-11-03 ENCOUNTER — Telehealth: Payer: Self-pay

## 2019-11-03 NOTE — Telephone Encounter (Signed)
PA initiated via Covermymeds; KEY: [id number]. Awaiting determination.

## 2019-11-03 NOTE — Telephone Encounter (Signed)
PA approved.   Request Reference Number: [id number]. ZOLPIDEM TAB 10MG  is approved through 04/21/2020. Your patient may now fill this prescription and it will be covered.

## 2019-11-04 NOTE — Assessment & Plan Note (Signed)
DM, HTN, hyperlipidemia, GERD: The patient requests refills, she moved to [place] and is in the process of finding a new doctor. No recent ambulatory BPs, the last time she checked was few weeks ago and it was "slightly low". No recent ambulatory CBGs. Has some dysphagia as described above but no weight loss or blood in the stools. Plan: Refill all meds for 2 months, Ambien for 1 month. Encouraged to start checking ambulatory CBGs and BPs. She plans to establish with a new PCP within the next 2 months but nevertheless she could call me if needed. Encouraged to discuss dysphagia with new PCP.
